# Patient Record
Sex: Male | Born: 1955 | Race: White | Hispanic: No | Marital: Married | Smoking: Never smoker
Health system: Southern US, Community
[De-identification: ages and names within clinical notes are randomized; demographics above are authoritative.]

## PROBLEM LIST (undated history)

## (undated) DIAGNOSIS — G20 Parkinson's disease: Secondary | ICD-10-CM

## (undated) DIAGNOSIS — R6520 Severe sepsis without septic shock: Secondary | ICD-10-CM

## (undated) DIAGNOSIS — I10 Essential (primary) hypertension: Secondary | ICD-10-CM

## (undated) DIAGNOSIS — R079 Chest pain, unspecified: Secondary | ICD-10-CM

## (undated) DIAGNOSIS — A419 Sepsis, unspecified organism: Secondary | ICD-10-CM

## (undated) DIAGNOSIS — M419 Scoliosis, unspecified: Secondary | ICD-10-CM

## (undated) DIAGNOSIS — Z8616 Personal history of COVID-19: Secondary | ICD-10-CM

## (undated) DIAGNOSIS — N200 Calculus of kidney: Secondary | ICD-10-CM

## (undated) DIAGNOSIS — E119 Type 2 diabetes mellitus without complications: Secondary | ICD-10-CM

## (undated) DIAGNOSIS — R413 Other amnesia: Secondary | ICD-10-CM

## (undated) DIAGNOSIS — E7800 Pure hypercholesterolemia, unspecified: Secondary | ICD-10-CM

## (undated) DIAGNOSIS — T8859XA Other complications of anesthesia, initial encounter: Secondary | ICD-10-CM

## (undated) HISTORY — DX: Scoliosis, unspecified: M41.9

## (undated) HISTORY — PX: COLONOSCOPY: SHX174

## (undated) HISTORY — DX: Parkinson's disease: G20

## (undated) HISTORY — DX: Chest pain, unspecified: R07.9

## (undated) HISTORY — PX: HERNIA REPAIR: SHX51

## (undated) HISTORY — DX: Sepsis, unspecified organism: A41.9

## (undated) HISTORY — DX: Essential (primary) hypertension: I10

## (undated) HISTORY — DX: Type 2 diabetes mellitus without complications: E11.9

---
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
## 2005-09-09 ENCOUNTER — Ambulatory Visit: Payer: Self-pay | Admitting: Gastroenterology

## 2005-09-20 ENCOUNTER — Ambulatory Visit (HOSPITAL_COMMUNITY): Admission: RE | Admit: 2005-09-20 | Discharge: 2005-09-20 | Payer: Self-pay | Admitting: Gastroenterology

## 2005-09-20 ENCOUNTER — Encounter (INDEPENDENT_AMBULATORY_CARE_PROVIDER_SITE_OTHER): Payer: Self-pay | Admitting: Specialist

## 2005-09-20 ENCOUNTER — Ambulatory Visit: Payer: Self-pay | Admitting: Gastroenterology

## 2006-06-03 ENCOUNTER — Emergency Department (HOSPITAL_COMMUNITY): Admission: EM | Admit: 2006-06-03 | Discharge: 2006-06-03 | Payer: Self-pay | Admitting: Emergency Medicine

## 2006-06-09 ENCOUNTER — Emergency Department (HOSPITAL_COMMUNITY): Admission: EM | Admit: 2006-06-09 | Discharge: 2006-06-09 | Payer: Self-pay | Admitting: Emergency Medicine

## 2009-02-22 ENCOUNTER — Emergency Department (HOSPITAL_COMMUNITY): Admission: EM | Admit: 2009-02-22 | Discharge: 2009-02-22 | Payer: Self-pay | Admitting: Emergency Medicine

## 2009-02-26 ENCOUNTER — Ambulatory Visit (HOSPITAL_COMMUNITY): Admission: RE | Admit: 2009-02-26 | Discharge: 2009-02-26 | Payer: Self-pay | Admitting: Urology

## 2010-05-02 LAB — URINALYSIS, ROUTINE W REFLEX MICROSCOPIC
Bilirubin Urine: NEGATIVE
Leukocytes, UA: NEGATIVE
Nitrite: NEGATIVE
Protein, ur: NEGATIVE mg/dL
Urobilinogen, UA: 0.2 mg/dL (ref 0.0–1.0)
pH: 5 (ref 5.0–8.0)

## 2010-05-02 LAB — DIFFERENTIAL
Basophils Absolute: 0 10*3/uL (ref 0.0–0.1)
Basophils Relative: 0 % (ref 0–1)
Eosinophils Relative: 1 % (ref 0–5)
Lymphocytes Relative: 29 % (ref 12–46)
Monocytes Absolute: 0.6 10*3/uL (ref 0.1–1.0)
Monocytes Relative: 8 % (ref 3–12)

## 2010-05-02 LAB — BASIC METABOLIC PANEL
BUN: 29 mg/dL — ABNORMAL HIGH (ref 6–23)
CO2: 22 mEq/L (ref 19–32)
Creatinine, Ser: 1.03 mg/dL (ref 0.4–1.5)
GFR calc non Af Amer: >60 mL/min (ref >60)

## 2010-05-02 LAB — URINE MICROSCOPIC-ADD ON

## 2010-05-02 LAB — CBC: RBC: 4.57 MIL/uL (ref 4.22–5.81)

## 2010-07-02 NOTE — Consult Note (Signed)
NAME:  Leonard Mercer, Leonard Mercer NO.:  [id number]   MEDICAL RECORD NO.:  [id number]          PATIENT TYPE:  AMB   LOCATION:                                FACILITY:  [facility]   PHYSICIAN:  Kassie Mends, M.D.      DATE OF BIRTH:  17-Feb-1955   DATE OF CONSULTATION:  09/09/2005  DATE OF DISCHARGE:                                   CONSULTATION   Dear Dr. Juanetta Gosling:   I am seeing Leonard Mercer as a new patient in consultation per your request.  I  am seeing Leonard Mercer for blood in the stool.   Leonard Mercer is a 55 year old male who has seen his blood in his stool when  wiping over the last couple of months.  He was seen in your office and  laboratories were obtained.  His white count was 9,  hemoglobin 15.9,  platelets 240,000, creatinine 1.38, albumin 4.4, AST 20, ALT 23, total  bilirubin 1.3.  He had 1 out of 3 hemoccult cards which were positive.  Mr.  Mercer denies weight loss, constipation, or diarrhea.  He may use one Motrin  a month.  He denies heartburn, indigestion, abdominal pain, black tarry  stool, nausea, vomiting, or difficulty swallowing.   PAST MEDICAL HISTORY:  Hyperlipidemia and hypertension.  He has no past  surgical history.   He is not allergic to any medications. He takes Cozaar, multivitamins, and  Motrin p.r.n. He denies any family history of colon, uterine, or ovarian  cancer.  Maternal grandmother had breast cancer.  He denies any family  history of colon polyps. He is married and has one child.  He is an Psychologist, prison and probation services at [employer]  He denies any tobacco or alcohol use.   REVIEW OF SYSTEMS:  As per the HPI.  Otherwise, negative except he denies  ripping, tearing rectal pain or rectal itching.   PHYSICAL EXAMINATION:  VITAL SIGNS:  His weight is 191.5 pounds, height 6  feet, temperature 98.5, blood pressure 132/90, pulse 68.  GENERAL:  He is no apparent distress, alert and oriented times four. HEENT:  Normocephalic and atraumatic.  Pupils equal,  round, and reactive to light.  Mouth with no oral lesions.  Posterior pharynx without erythema or exudate.  NECK:  Full range of motion and no lymphadenopathy. LUNGS:  Clear to  auscultation bilaterally.  CARDIOVASCULAR:  Regular rhythm, no murmur.  Normal S1 and S2.  ABDOMEN:  Bowel sounds are present, soft, nontender,  nondistended.  No rebound or guarding.  No hepatosplenomegaly.  EXTREMITIES:  Without clubbing, cyanosis, or edema.  No focal neurologic deficits.   Leonard Mercer is a 55 year old male with heme-positive stool.  He is at average  risk for developing colon cancer.  Thank you for allowing me to see Leonard Mercer in consultation.  My list of  recommendations follows.   I recommend colonoscopy to evaluate heme-positive stools and EGD if the  colonoscopy does not reveal source for his heme-positive stool.  He may  follow up with our office as needed.   Please feel  free to contact me at [phone] with additional questions.      Kassie Mends, M.D.  Electronically Signed     SM/MEDQ  D:  09/09/2005  T:  09/09/2005  Job:  [id number]   cc:   Ramon Dredge L. Juanetta Gosling, M.D.  Fax: [phone]

## 2010-07-02 NOTE — Op Note (Signed)
NAME:  Leonard Mercer, Leonard Mercer                ACCOUNT NO.:  [id number]   MEDICAL RECORD NO.:  [id number]          PATIENT TYPE:  AMB   LOCATION:  DAY                           FACILITY:  [facility]   PHYSICIAN:  Kassie Mends, M.D.      DATE OF BIRTH:  [birth year]   DATE OF PROCEDURE:  DATE OF DISCHARGE:                                 OPERATIVE REPORT   PROCEDURE:  Colonoscopy with cold forceps biopsy.   INDICATIONS FOR PROCEDURE:  Leonard Mercer is a 55 year old male who presented  with heme-positive stools.   MEDICATIONS:  1. Demerol 100 mg IV.  2. Versed 6 mg IV.   FINDINGS:  1. Two 4-mm sessile ascending colon polyps removed via cold forceps; one      flat 6-mm polyp removed via cold forceps.  2. Rare sigmoid diverticulosis.  3. Otherwise no inflammatory changes, masses, or vascular ectasis seen.      Normal retroflexed view of the rectum.   RECOMMENDATIONS:  1. Follow up biopsies.  If the polyps are adenomatous, then next screening      colonoscopy should be in five years.  His first-degree relatives will      need to begin colon cancer screening at age 12 and every five years      after that.  The multiple colon polyps are likely source of his heme-      positive stools.  2. High-fiber diet.  High-fiber diet handout and handout on diverticulosis      given to the patient.   PROCEDURE TECHNIQUE:  A physical exam was performed and informed consent was  obtained from the patient after explaining the risks, benefits and  alternatives to the procedure which the patient appeared to understand and  so stated.  The patient was connecting to the monitoring device.  He was  placed in the left lateral position.  Continuous oxygen was provided by  nasal cannula and IV medicine administered through an indwelling cannula.  After  administration of sedation and rectal exam, the scope was advanced under  direct visualization to the cecum.  The scope was withdrawn while  subsequently examining the mucosal  integrity and anatomy on the way out.  The patient was recovered in the endoscopy suite and discharged to home in  satisfactory condition.      Kassie Mends, M.D.  Electronically Signed     SM/MEDQ  D:  09/21/2005  T:  09/21/2005  Job:  [id number]   cc:   Leonard Mercer, M.D.  Fax: [phone]

## 2010-09-16 ENCOUNTER — Encounter: Payer: Self-pay | Admitting: Gastroenterology

## 2011-02-16 ENCOUNTER — Encounter: Payer: Self-pay | Admitting: Gastroenterology

## 2012-01-03 ENCOUNTER — Encounter: Payer: Self-pay | Admitting: *Deleted

## 2012-05-29 DIAGNOSIS — Z9889 Other specified postprocedural states: Secondary | ICD-10-CM | POA: Insufficient documentation

## 2012-07-02 DIAGNOSIS — K5900 Constipation, unspecified: Secondary | ICD-10-CM | POA: Insufficient documentation

## 2014-07-20 ENCOUNTER — Emergency Department (HOSPITAL_COMMUNITY): Payer: BC Managed Care – PPO

## 2014-07-20 ENCOUNTER — Encounter (HOSPITAL_COMMUNITY): Payer: Self-pay | Admitting: *Deleted

## 2014-07-20 ENCOUNTER — Emergency Department (HOSPITAL_COMMUNITY)
Admission: EM | Admit: 2014-07-20 | Discharge: 2014-07-20 | Disposition: A | Discharge status: Home / Self Care | Payer: BC Managed Care – PPO | Attending: Emergency Medicine | Admitting: Emergency Medicine

## 2014-07-20 DIAGNOSIS — I10 Essential (primary) hypertension: Secondary | ICD-10-CM | POA: Diagnosis not present

## 2014-07-20 DIAGNOSIS — Z79899 Other long term (current) drug therapy: Secondary | ICD-10-CM | POA: Diagnosis not present

## 2014-07-20 DIAGNOSIS — R1030 Lower abdominal pain, unspecified: Secondary | ICD-10-CM | POA: Diagnosis present

## 2014-07-20 DIAGNOSIS — N201 Calculus of ureter: Secondary | ICD-10-CM | POA: Diagnosis not present

## 2014-07-20 DIAGNOSIS — Z7982 Long term (current) use of aspirin: Secondary | ICD-10-CM | POA: Insufficient documentation

## 2014-07-20 DIAGNOSIS — R109 Unspecified abdominal pain: Secondary | ICD-10-CM

## 2014-07-20 HISTORY — DX: Calculus of kidney: N20.0

## 2014-07-20 LAB — BASIC METABOLIC PANEL
Anion gap: 10 (ref 5–15)
BUN: 29 mg/dL — ABNORMAL HIGH (ref 6–20)
CO2: 25 mmol/L (ref 22–32)
Calcium: 8.8 mg/dL — ABNORMAL LOW (ref 8.9–10.3)
Chloride: 104 mmol/L (ref 101–111)
Creatinine, Ser: 1.1 mg/dL (ref 0.61–1.24)
GFR calc Af Amer: >60 mL/min (ref >60)
Glucose, Bld: 142 mg/dL — ABNORMAL HIGH (ref 65–99)
POTASSIUM: 4.1 mmol/L (ref 3.5–5.1)
SODIUM: 139 mmol/L (ref 135–145)

## 2014-07-20 LAB — CBC
HEMATOCRIT: 44.3 % (ref 39.0–52.0)
HEMOGLOBIN: 15.5 g/dL (ref 13.0–17.0)
MCH: 31.4 pg (ref 26.0–34.0)
MCHC: 35 g/dL (ref 30.0–36.0)
MCV: 89.9 fL (ref 78.0–100.0)
PLATELETS: 203 10*3/uL (ref 150–400)
RBC: 4.93 MIL/uL (ref 4.22–5.81)
RDW: 12.3 % (ref 11.5–15.5)
WBC: 11.4 10*3/uL — ABNORMAL HIGH (ref 4.0–10.5)

## 2014-07-20 LAB — URINE MICROSCOPIC-ADD ON

## 2014-07-20 LAB — URINALYSIS, ROUTINE W REFLEX MICROSCOPIC
Bilirubin Urine: NEGATIVE
Glucose, UA: NEGATIVE mg/dL
KETONES UR: NEGATIVE mg/dL
LEUKOCYTES UA: NEGATIVE
Nitrite: NEGATIVE
PH: 5.5 (ref 5.0–8.0)
PROTEIN: NEGATIVE mg/dL
SPECIFIC GRAVITY, URINE: 1.02 (ref 1.005–1.030)
Urobilinogen, UA: 0.2 mg/dL (ref 0.0–1.0)

## 2014-07-20 MED ORDER — HYDROMORPHONE HCL 1 MG/ML IJ SOLN
1.0000 mg | Freq: Once | INTRAMUSCULAR | Status: AC
  Administered 2014-07-20: 1 mg via INTRAVENOUS
  Filled 2014-07-20: qty 1

## 2014-07-20 MED ORDER — KETOROLAC TROMETHAMINE 30 MG/ML IJ SOLN
30.0000 mg | Freq: Once | INTRAMUSCULAR | Status: AC
  Administered 2014-07-20: 30 mg via INTRAVENOUS
  Filled 2014-07-20: qty 1

## 2014-07-20 MED ORDER — OXYCODONE-ACETAMINOPHEN 5-325 MG PO TABS: 1.0000 | ORAL_TABLET | ORAL | Status: DC | PRN

## 2014-07-20 MED ORDER — TAMSULOSIN HCL 0.4 MG PO CAPS: 0.4000 mg | ORAL_CAPSULE | Freq: Every day | ORAL | Status: DC

## 2014-07-20 MED ORDER — IBUPROFEN 600 MG PO TABS: 600.0000 mg | ORAL_TABLET | Freq: Three times a day (TID) | ORAL | Status: DC | PRN

## 2014-07-20 MED ORDER — ONDANSETRON HCL 4 MG/2ML IJ SOLN
4.0000 mg | Freq: Once | INTRAMUSCULAR | Status: AC
  Administered 2014-07-20: 4 mg via INTRAVENOUS
  Filled 2014-07-20: qty 2

## 2014-07-20 MED ORDER — ONDANSETRON 8 MG PO TBDP: 8.0000 mg | ORAL_TABLET | Freq: Three times a day (TID) | ORAL | Status: DC | PRN

## 2014-07-20 NOTE — Discharge Instructions (Signed)
Ureteral Colic (Kidney Stones) °Ureteral colic is the result of a condition when kidney stones form inside the kidney. Once kidney stones are formed they may move into the tube that connects the kidney with the bladder (ureter). If this occurs, this condition may cause pain (colic) in the ureter.  °CAUSES  °Pain is caused by stone movement in the ureter and the obstruction caused by the stone. °SYMPTOMS  °The pain comes and goes as the ureter contracts around the stone. The pain is usually intense, sharp, and stabbing in character. The location of the pain may move as the stone moves through the ureter. When the stone is near the kidney the pain is usually located in the back and radiates to the belly (abdomen). When the stone is ready to pass into the bladder the pain is often located in the lower abdomen on the side the stone is located. At this location, the symptoms may mimic those of a urinary tract infection with urinary frequency. Once the stone is located here it often passes into the bladder and the pain disappears completely. °TREATMENT  °· Your caregiver will provide you with medicine for pain relief. °· You may require specialized follow-up X-rays. °· The absence of pain does not always mean that the stone has passed. It may have just stopped moving. If the urine remains completely obstructed, it can cause loss of kidney function or even complete destruction of the involved kidney. It is your responsibility and in your interest that X-rays and follow-ups as suggested by your caregiver are completed. Relief of pain without passage of the stone can be associated with severe damage to the kidney, including loss of kidney function on that side. °· If your stone does not pass on its own, additional measures may be taken by your caregiver to ensure its removal. °HOME CARE INSTRUCTIONS  °· Increase your fluid intake. Water is the preferred fluid since juices containing vitamin C may acidify the urine making it  less likely for certain stones (uric acid stones) to pass. °· Strain all urine. A strainer will be provided. Keep all particulate matter or stones for your caregiver to inspect. °· Take your pain medicine as directed. °· Make a follow-up appointment with your caregiver as directed. °· Remember that the goal is passage of your stone. The absence of pain does not mean the stone is gone. Follow your caregiver's instructions. °· Only take over-the-counter or prescription medicines for pain, discomfort, or fever as directed by your caregiver. °SEEK MEDICAL CARE IF:  °· Pain cannot be controlled with the prescribed medicine. °· You have a fever. °· Pain continues for longer than your caregiver advises it should. °· There is a change in the pain, and you develop chest discomfort or constant abdominal pain. °· You feel faint or pass out. °MAKE SURE YOU:  °· Understand these instructions. °· Will watch your condition. °· Will get help right away if you are not doing well or get worse. °Document Released: 11/10/2004 Document Revised: 05/28/2012 Document Reviewed: 07/28/2010 °ExitCare® Patient Information ©2015 [employer]. This information is not intended to replace advice given to you by your health care provider. Make sure you discuss any questions you have with your health care provider. ° °

## 2014-07-20 NOTE — ED Provider Notes (Signed)
CSN: [id number]     Arrival date & time 07/20/14  7829 History   First MD Initiated Contact with Patient 07/20/14 [phone]     Chief Complaint  Patient presents with  . Flank Pain    left flank pain with past hx of kidney stone. feels the same per patient      HPI Patient presents with severe left flank pain with associated nausea.  This began today.  He has a history kidney stones.  His last kidney stone was approximately 2 years ago.  He denies vomiting.  Denies abdominal pain.  He states his discomfort and pain is focused in his left flank.  This pain is moderate to severe in severity at this time.  He is pacing in the room.   Past Medical History  Diagnosis Date  . Hypertension   . Chest pain   . Kidney stone    History reviewed. No pertinent past surgical history. Family History  Problem Relation Age of Onset  . Hypertension Mother   . Diabetes Father   . Heart disease Maternal Grandfather    History  Substance Use Topics  . Smoking status: Unknown If Ever Smoked  . Smokeless tobacco: Not on file  . Alcohol Use: Yes    Review of Systems  All other systems reviewed and are negative.     Allergies  Review of patient's allergies indicates no known allergies.  Home Medications   Prior to Admission medications   Medication Sig Start Date End Date Taking? Authorizing Provider  losartan (COZAAR) 100 MG tablet Take 50 mg by mouth daily.    Yes Historical Provider, MD  metFORMIN (GLUCOPHAGE) 1000 MG tablet Take 1,000 mg by mouth 2 (two) times daily with a meal.   Yes Historical Provider, MD  pravastatin (PRAVACHOL) 20 MG tablet Take 20 mg by mouth daily.   Yes Historical Provider, MD  aspirin 81 MG tablet Take 81 mg by mouth daily.    Historical Provider, MD  Multiple Vitamin (MULTI-VITAMIN PO) Take by mouth daily.    Historical Provider, MD   BP 160/99 mmHg  Pulse 73  Temp(Src) 97.7 F (36.5 C) (Oral)  Resp 20  Ht  (1.753 m)  Wt 189 lb (85.73 kg)  BMI 27.90  kg/m2  SpO2 100% Physical Exam  Constitutional: He is oriented to person, place, and time. He appears well-developed and well-nourished.  Uncomfortable appearing  HENT:  Head: Normocephalic and atraumatic.  Eyes: EOM are normal.  Neck: Normal range of motion.  Cardiovascular: Normal rate, regular rhythm, normal heart sounds and intact distal pulses.   Pulmonary/Chest: Effort normal and breath sounds normal. No respiratory distress.  Abdominal: Soft. He exhibits no distension. There is no tenderness.  Musculoskeletal: Normal range of motion.  Neurological: He is alert and oriented to person, place, and time.  Skin: Skin is warm and dry.  Psychiatric: He has a normal mood and affect. Judgment normal.  Nursing note and vitals reviewed.   ED Course  Procedures (including critical care time) Labs Review Labs Reviewed  CBC - Abnormal; Notable for the following:    WBC 11.4 (*)    All other components within normal limits  BASIC METABOLIC PANEL - Abnormal; Notable for the following:    Glucose, Bld 142 (*)    BUN 29 (*)    Calcium 8.8 (*)    All other components within normal limits  URINALYSIS, ROUTINE W REFLEX MICROSCOPIC (NOT AT [facility]) - Abnormal; Notable for the following:  Hgb urine dipstick LARGE (*)    All other components within normal limits  URINE CULTURE  URINE MICROSCOPIC-ADD ON    Imaging Review Ct Renal Stone Study  07/20/2014   CLINICAL DATA:  Left flank pain.  Difficulty with urination.  EXAM: CT ABDOMEN AND PELVIS WITHOUT CONTRAST  TECHNIQUE: Multidetector CT imaging of the abdomen and pelvis was performed following the standard protocol without IV contrast.  COMPARISON:  CT 02/22/2009  FINDINGS: The included lung bases are clear.  There is a 5-6 mm obstructing stone at the left ureterovesicular junction with mild resultant hydroureteronephrosis and perinephric stranding. At least 3 additional nonobstructing stones are seen in the left kidney. No definite right  nephrolithiasis, no right obstructive uropathy.  Evaluation of the remaining solid and hollow viscera is limited given lack of contrast. The unenhanced liver, gallbladder, spleen, and adrenal glands are normal. Fatty infiltration of the pancreas without surrounding inflammation or ductal dilatation.  Stomach is decompressed. Small hiatal hernia. There are no dilated or thickened bowel loops. The appendix is normal. Moderate volume of colonic stool without colonic wall thickening. Moderate diverticulosis of the distal colon without diverticulitis. No free air, free fluid, or intra-abdominal fluid collection.  No retroperitoneal adenopathy. Abdominal aorta is normal in caliber. Tortuosity of the abdominal aorta with scattered atherosclerosis, no aneurysm.  In the pelvis prostate gland is normal in size. There is minimal fat in the right inguinal canal. No pelvic free fluid. No pelvic adenopathy.  There are no acute or suspicious osseous abnormalities. Scoliotic curvature of the spine.  IMPRESSION: 1. Obstructing throat 6 mm stone at the left ureterovesicular junction with resultant hydroureteronephrosis and perinephric stranding. 2. At least 3 additional nonobstructing stones in the left kidney. 3. Diverticulosis without diverticulitis.   Electronically Signed   By: Rubye OaksMelanie  Ehinger M.D.   On: 07/20/2014 05:05  I personally reviewed the imaging tests through PACS system I reviewed available ER/hospitalization records through the EMR    EKG Interpretation None      MDM   Final diagnoses:  Flank pain      5:41 AM Pain improved. Dc home. Urology follow up. Standard stone precautions  Azalia BilisKevin Shaylea Ucci, MD 07/20/14 [phone]

## 2014-07-21 LAB — URINE CULTURE
Colony Count: NO GROWTH
Culture: NO GROWTH

## 2014-07-30 MED FILL — Oxycodone w/ Acetaminophen Tab 5-325 MG: ORAL | Qty: 6 | Status: AC

## 2015-05-02 DIAGNOSIS — E119 Type 2 diabetes mellitus without complications: Secondary | ICD-10-CM | POA: Insufficient documentation

## 2015-05-02 DIAGNOSIS — I10 Essential (primary) hypertension: Secondary | ICD-10-CM | POA: Insufficient documentation

## 2015-05-20 DIAGNOSIS — E785 Hyperlipidemia, unspecified: Secondary | ICD-10-CM | POA: Insufficient documentation

## 2015-05-20 DIAGNOSIS — N529 Male erectile dysfunction, unspecified: Secondary | ICD-10-CM | POA: Insufficient documentation

## 2015-05-20 DIAGNOSIS — E782 Mixed hyperlipidemia: Secondary | ICD-10-CM | POA: Insufficient documentation

## 2015-05-26 DIAGNOSIS — G8929 Other chronic pain: Secondary | ICD-10-CM | POA: Insufficient documentation

## 2015-05-26 DIAGNOSIS — R251 Tremor, unspecified: Secondary | ICD-10-CM | POA: Insufficient documentation

## 2016-04-01 ENCOUNTER — Ambulatory Visit (INDEPENDENT_AMBULATORY_CARE_PROVIDER_SITE_OTHER): Payer: BC Managed Care – PPO | Admitting: Urology

## 2016-04-01 DIAGNOSIS — N5201 Erectile dysfunction due to arterial insufficiency: Secondary | ICD-10-CM | POA: Diagnosis not present

## 2016-04-01 DIAGNOSIS — E291 Testicular hypofunction: Secondary | ICD-10-CM | POA: Diagnosis not present

## 2016-09-12 ENCOUNTER — Encounter: Payer: Self-pay | Admitting: Neurology

## 2016-09-12 ENCOUNTER — Telehealth: Payer: Self-pay | Admitting: Neurology

## 2016-09-12 ENCOUNTER — Ambulatory Visit (INDEPENDENT_AMBULATORY_CARE_PROVIDER_SITE_OTHER): Payer: BC Managed Care – PPO | Admitting: Neurology

## 2016-09-12 DIAGNOSIS — G20 Parkinson's disease: Secondary | ICD-10-CM | POA: Diagnosis not present

## 2016-09-12 DIAGNOSIS — G20A1 Parkinson's disease without dyskinesia, without mention of fluctuations: Secondary | ICD-10-CM

## 2016-09-12 HISTORY — DX: Parkinson's disease without dyskinesia, without mention of fluctuations: G20.A1

## 2016-09-12 HISTORY — DX: Parkinson's disease: G20

## 2016-09-12 MED ORDER — PRAMIPEXOLE DIHYDROCHLORIDE 0.125 MG PO TABS: 0.3750 mg | ORAL_TABLET | Freq: Three times a day (TID) | ORAL | 1 refills | Status: DC

## 2016-09-12 NOTE — Patient Instructions (Signed)
   We will get MRI of the brain.  We will start Mirapex 0.125 mg tablets:  Week 1-2  One three times a day.  Week 3-4  2 three times a day  Week 5-6 3 tablets thee times a day.  Call for a prescription of the 0.5 mg tablets.  Mirapex (pramipexole) may result in confusion or hallucinations, dizziness, drowsiness, or nausea. If any significant side effects are noted, please contact our office.

## 2016-09-12 NOTE — Progress Notes (Signed)
Reason for visit: Tremor  Referring physician: Dr. Ernestine ConradKaplan  Leonard Mercer is a 61 y.o. male  History of present illness:  Leonard Mercer is a 61 year old right-handed white male with a history of a tremor that developed on the right hand about 2 years ago. The tremor is a true resting tremor that has gradually worsened over time, and more recently has started to involve the left hand as well. He indicates that his handwriting has changed, he has developed micrographia and some sloppiness of the handwriting. The tremor does not interfere with his ability to feed himself. He has been told by coworkers that he keeps his right arm in flexion when he walks. The patient has not had any definite change in speech or swallowing, he denies any changes in balance or difficulty getting up from a seated position. He reports no numbness or weakness of the extremities and no problems controlling the bowels or the bladder. He denies any family history of tremor. He is sent to this office for further evaluation. He has not reported any changes in memory or concentration.  Past Medical History:  Diagnosis Date  . Chest pain   . Hypertension   . Kidney stone     Past Surgical History:  Procedure Laterality Date  . HERNIA REPAIR      Family History  Problem Relation Age of Onset  . Hypertension Mother   . Diabetes Father   . Heart disease Maternal Grandfather     Social history:  reports that he has never smoked. He has never used smokeless tobacco. He reports that he drinks alcohol. He reports that he does not use drugs.  Medications:  Prior to Admission medications   Medication Sig Start Date End Date Taking? Authorizing Provider  losartan (COZAAR) 50 MG tablet Take 50 mg by mouth daily. 09/01/16  Yes [provider]  metFORMIN (GLUCOPHAGE) 1000 MG tablet Take 1,000 mg by mouth 2 (two) times daily with a meal.   Yes [provider]  Multiple Vitamin (MULTI-VITAMIN PO) Take by  mouth daily.   Yes [provider]  [facility] 0.5 milliliter intramuscularly 07/02/16  Yes [provider]  pravastatin (PRAVACHOL) 20 MG tablet Take 20 mg by mouth daily.    [provider]     No Known Allergies  ROS:  Out of a complete 14 system review of symptoms, the patient complains only of the following symptoms, and all other reviewed systems are negative.  Tremor Sloppy handwriting  Blood pressure (!) 136/96, pulse 76, height 5\' 11"  (1.803 m), weight 184 lb 8 oz (83.7 kg).  Physical Exam  General: The patient is alert and cooperative at the time of the examination.  Eyes: Pupils are equal, round, and reactive to light. Discs are flat bilaterally.  Neck: The neck is supple, no carotid bruits are noted.  Respiratory: The respiratory examination is clear.  Cardiovascular: The cardiovascular examination reveals a regular rate and rhythm, no obvious murmurs or rubs are noted.  Skin: Extremities are without significant edema.  Neurologic Exam  Mental status: The patient is alert and oriented x 3 at the time of the examination. The patient has apparent normal recent and remote memory, with an apparently normal attention span and concentration ability.  Cranial nerves: Facial symmetry is present. There is good sensation of the face to pinprick and soft touch bilaterally. The strength of the facial muscles and the muscles to head turning and shoulder shrug are normal  bilaterally. Speech is well enunciated, no aphasia or dysarthria is noted. Extraocular movements are full. Visual fields are full. The tongue is midline, and the patient has symmetric elevation of the soft palate. No obvious hearing deficits are noted. Mild masking of the face is seen.  Motor: The motor testing reveals 5 over 5 strength of all 4 extremities. Good symmetric motor tone is noted throughout.  Sensory: Sensory testing is intact to pinprick, soft touch, vibration  sensation, and position sense on all 4 extremities. No evidence of extinction is noted.  Coordination: Cerebellar testing reveals good finger-nose-finger and heel-to-shin bilaterally. A resting tremors noted on the right greater left upper extremities.  Gait and station: The patient is able to arise from a seated position with arms crossed. Once up, he is able to ambulate independently the good stride and good turns, there is decreased arm swing on the right with tremor seen on the right hand with walking. Tandem gait is normal. Romberg is negative. No drift is seen.  Reflexes: Deep tendon reflexes are symmetric and normal bilaterally. Toes are downgoing bilaterally.   Assessment/Plan:  1. Parkinson's disease  The patient appears to have Parkinson's disease primarily with right-sided features. The patient will be placed on Mirapex working up to 0.375 mg 3 times daily. He will call me at that point and we will convert to 0.5 mg 3 times daily. The patient will watch out for side effects. He will be sent for MRI evaluation of the brain. He will follow-up in 5 months. He is to remain active.  Marlan Palau. Keith Yuan Gann MD 09/12/2016 11:31 AM  [facility] [address] [place], [place] [id number]  Phone [phone] Fax [phone]

## 2016-09-12 NOTE — Telephone Encounter (Signed)
Pt called to advise he will not be in town all next week and also would like to have the MRI appt on weekend.

## 2016-09-12 NOTE — Telephone Encounter (Signed)
Noted, thank you

## 2016-09-13 ENCOUNTER — Telehealth: Payer: Self-pay | Admitting: Neurology

## 2016-09-14 NOTE — Telephone Encounter (Signed)
error 

## 2016-09-16 ENCOUNTER — Telehealth: Payer: Self-pay | Admitting: Neurology

## 2016-09-16 NOTE — Telephone Encounter (Signed)
I called patient. The patient had gone off of his pravastatin previously as he felt this was the etiology of his tremors, he likely has Parkinson's disease, okay to go back on pravastatin.

## 2016-09-16 NOTE — Telephone Encounter (Signed)
Pt called the office said he stopped taking pravastatin (PRAVACHOL) 20 MG tablet about 2-3 wks, a side effect could be muscle twitching. He said since Dr Anne HahnWillis dx him with parkinsons should he start it again. Please call to discuss

## 2016-09-19 NOTE — Telephone Encounter (Signed)
[facility].

## 2016-10-01 ENCOUNTER — Ambulatory Visit
Admission: RE | Admit: 2016-10-01 | Discharge: 2016-10-01 | Disposition: A | Discharge status: Home / Self Care | Payer: BC Managed Care – PPO | Source: Ambulatory Visit | Attending: Neurology | Admitting: Neurology

## 2016-10-01 DIAGNOSIS — G20 Parkinson's disease: Secondary | ICD-10-CM

## 2016-10-03 ENCOUNTER — Telehealth: Payer: Self-pay | Admitting: Neurology

## 2016-10-03 NOTE — Telephone Encounter (Signed)
I called the patient. MRI the brain was unremarkable, the patient likely has true idiopathic Parkinson's disease.   MRI brain 10/02/16:  IMPRESSION:  This noncontrasted MRI of the brain shows the following: 1.    There are a few T2/FLAIR hyperintense foci in the hemispheres consistent with minimal age-appropriate chronic microvessel ischemic change. None of these appear to be acute. 2.    Maxillary and ethmoid chronic sinusitis 3.   There are no acute findings

## 2016-10-24 ENCOUNTER — Telehealth: Payer: Self-pay | Admitting: Neurology

## 2016-10-24 MED ORDER — PRAMIPEXOLE DIHYDROCHLORIDE 0.5 MG PO TABS: 0.5000 mg | ORAL_TABLET | Freq: Three times a day (TID) | ORAL | 3 refills | Status: DC

## 2016-10-24 NOTE — Addendum Note (Signed)
Addended by: York SpanielWILLIS, Latondra Gebhart K on: 10/24/2016 02:58 PM   Modules accepted: Orders

## 2016-10-24 NOTE — Telephone Encounter (Signed)
A prescription for this 0.5 mg Mirapex tablets will be sent in taking 1 tablet 3 times daily.

## 2016-10-24 NOTE — Telephone Encounter (Addendum)
Pt called in he will finish titrating up on pramipexole (MIRAPEX) 0.125 MG tablet in 4 days and is wanting new RX sent to [facility]

## 2017-02-21 ENCOUNTER — Encounter: Payer: Self-pay | Admitting: Neurology

## 2017-02-21 ENCOUNTER — Ambulatory Visit: Payer: BC Managed Care – PPO | Admitting: Neurology

## 2017-02-21 ENCOUNTER — Encounter (INDEPENDENT_AMBULATORY_CARE_PROVIDER_SITE_OTHER): Payer: Self-pay

## 2017-02-21 VITALS — BP 141/87 | HR 92 | Ht 71.0 in | Wt 177.0 lb

## 2017-02-21 DIAGNOSIS — G20 Parkinson's disease: Secondary | ICD-10-CM | POA: Diagnosis not present

## 2017-02-21 MED ORDER — CARBIDOPA-LEVODOPA 25-100 MG PO TABS: 1.0000 | ORAL_TABLET | Freq: Three times a day (TID) | ORAL | 1 refills | Status: DC

## 2017-02-21 NOTE — Progress Notes (Signed)
Reason for visit: Parkinson's disease  Leonard Mercer is an 62 y.o. male  History of present illness:  Leonard Mercer is a 62 year old right-handed white male with a history of Parkinson's disease.  The patient has been placed on Mirapex, he has been worked up to the 0.5 mg 3 times daily dosing.  He has tolerated this fairly well.  He continues to have tremors on both upper extremities, right greater than left.  He has fallen on one occasion since last seen around Thanksgiving of 2018.  He did sustain a laceration on his head.  The patient has not had any further falls.  He denies issues with choking with swallowing.  He is trying to exercise regularly, he is not sleeping well at night and he feels fatigued during the day and has difficulty concentrating and remembering things during the day.  He returns for an evaluation.  Past Medical History:  Diagnosis Date  . Chest pain   . Hypertension   . Kidney stone   . Parkinson's disease (HCC) 09/12/2016    Past Surgical History:  Procedure Laterality Date  . HERNIA REPAIR      Family History  Problem Relation Age of Onset  . Hypertension Mother   . Diabetes Father   . Heart disease Maternal Grandfather     Social history:  reports that  has never smoked. he has never used smokeless tobacco. He reports that he drinks alcohol. He reports that he does not use drugs.   No Known Allergies  Medications:  Prior to Admission medications   Medication Sig Start Date End Date Taking? Authorizing Provider  losartan (COZAAR) 50 MG tablet Take 50 mg by mouth daily. 09/01/16  Yes [provider]  metFORMIN (GLUCOPHAGE) 1000 MG tablet Take 1,000 mg by mouth 2 (two) times daily with a meal.   Yes [provider]  Multiple Vitamin (MULTI-VITAMIN PO) Take by mouth daily.   Yes [provider]  pramipexole (MIRAPEX) 0.5 MG tablet Take 1 tablet (0.5 mg total) by mouth 3 (three) times daily. 10/24/16  Yes [name], MD    pravastatin (PRAVACHOL) 20 MG tablet Take 20 mg by mouth daily.   Yes [provider]  [facility] injection inject 0.5 milliliter intramuscularly 07/02/16   [provider]    ROS:  Out of a complete 14 system review of symptoms, the patient complains only of the following symptoms, and all other reviewed systems are negative.  Tremors  Blood pressure (!) 141/87, pulse 92, height 5\' 11"  (1.803 m), weight 177 lb (80.3 kg).  Physical Exam  General: The patient is alert and cooperative at the time of the examination.  Skin: No significant peripheral edema is noted.   Neurologic Exam  Mental status: The patient is alert and oriented x 3 at the time of the examination. The patient has apparent normal recent and remote memory, with an apparently normal attention span and concentration ability.   Cranial nerves: Facial symmetry is present. Speech is normal, no aphasia or dysarthria is noted. Extraocular movements are full. Visual fields are full.  Masking of the face is seen.  Motor: The patient has good strength in all 4 extremities.  Sensory examination: Soft touch sensation is symmetric on the face, arms, and legs.  Coordination: The patient has good finger-nose-finger and heel-to-shin bilaterally.  Resting tremors are noted on both upper extremities, right greater than left.  Gait and station: The patient has the ability to arise from  a seated position with arms crossed.  Once up, the patient can walk independently, he has decreased arm swing on the right greater than left upper extremities.  Tremors are seen with walking.  Tandem gait is slightly unsteady.  Romberg is negative. No drift is seen.  Reflexes: Deep tendon reflexes are symmetric.   MRI brain 10/02/16:  IMPRESSION: This noncontrasted MRI of the brain shows the following: 1. There are a few T2/FLAIR hyperintense foci in the hemispheres consistent with minimal age-appropriate chronic microvessel  ischemic change. None of these appear to be acute. 2. Maxillary and ethmoid chronic sinusitis 3. There are no acute findings     Assessment/Plan:  1.  Parkinson's disease  2.  Insomnia  The patient is not sleeping well, he will try taking Benadryl at night for sleep, if this does not work we will try trazodone in the future.  The patient will be placed on Sinemet taking the 25/100 mg tablets taking 1/2 tablet 3 times daily for 6 weeks and then go to 1 full tablet 3 times daily.  The patient will follow-up in 5 months.  He will continue on the Mirapex.  We will consider increasing the dose of Mirapex in the future.    Marlan Palau. Keith Willis MD 02/21/2017 4:05 PM  [facility] [address] [place], [place] [address]  Phone [phone] Fax [phone]

## 2017-02-21 NOTE — Patient Instructions (Signed)
   We will start Sinemet 25/100 mg tablets, take 1/2 tablet three times a day for 6 weeks, then take one full tablet three times a day.  Take benadryl from 25 mg to 100 mg at night if needed for sleep.  Sinemet (carbidopa) may result in confusion or hallucinations, drowsiness, nausea, or dizziness. If any significant side effects are noted, please contact our office. Sinemet may not be well absorbed when taken with high protein meals, if tolerated it is best to take 30-45 minutes before you eat.

## 2017-02-23 ENCOUNTER — Other Ambulatory Visit: Payer: Self-pay | Admitting: *Deleted

## 2017-02-23 MED ORDER — PRAMIPEXOLE DIHYDROCHLORIDE 0.5 MG PO TABS: 0.5000 mg | ORAL_TABLET | Freq: Three times a day (TID) | ORAL | 3 refills | Status: DC

## 2017-06-11 ENCOUNTER — Other Ambulatory Visit: Payer: Self-pay | Admitting: Neurology

## 2017-07-23 ENCOUNTER — Other Ambulatory Visit: Payer: Self-pay | Admitting: Neurology

## 2017-08-03 ENCOUNTER — Encounter: Payer: Self-pay | Admitting: Neurology

## 2017-08-03 ENCOUNTER — Ambulatory Visit: Payer: BC Managed Care – PPO | Admitting: Neurology

## 2017-08-03 VITALS — BP 135/84 | HR 76 | Ht 71.0 in | Wt 169.5 lb

## 2017-08-03 DIAGNOSIS — G20 Parkinson's disease: Secondary | ICD-10-CM

## 2017-08-03 DIAGNOSIS — R498 Other voice and resonance disorders: Secondary | ICD-10-CM

## 2017-08-03 MED ORDER — PRAMIPEXOLE DIHYDROCHLORIDE 0.75 MG PO TABS: 0.7500 mg | ORAL_TABLET | Freq: Three times a day (TID) | ORAL | 1 refills | Status: DC

## 2017-08-03 MED ORDER — CARBIDOPA-LEVODOPA 25-100 MG PO TABS: 1.0000 | ORAL_TABLET | Freq: Three times a day (TID) | ORAL | 1 refills | Status: DC

## 2017-08-03 NOTE — Patient Instructions (Signed)
We will get speech therapy for the voice.  With the mirapex, we will increase the dose:  Week 1-2: 0.5/ 0.5/ 0.75  Week 3-4: 0.75/ 0.5/ 0.75  Then start 0.75 mg three times a day.

## 2017-08-03 NOTE — Progress Notes (Signed)
Reason for visit: Parkinson's disease  Leonard Mercer is an 62 y.o. male  History of present illness:  Leonard Mercer is a 62 year old right-handed white male with a history of Parkinson's disease.  The patient reports tremors affecting the jaw and both upper extremities.  The patient has noted problems with handwriting, he has significant micrographia.  He denies any falls, he occasionally have some choking with swallowing but this is not a big problem.  He reports some troubles with hypophonia.  He is tolerating the medications well, he is on Sinemet and Mirapex.  In general, he is sleeping fairly well at night at this point.   Past Medical History:  Diagnosis Date  . Chest pain   . Hypertension   . Kidney stone   . Parkinson's disease (HCC) 09/12/2016    Past Surgical History:  Procedure Laterality Date  . HERNIA REPAIR      Family History  Problem Relation Age of Onset  . Hypertension Mother   . Diabetes Father   . Heart disease Maternal Grandfather     Social history:  reports that he has never smoked. He has never used smokeless tobacco. He reports that he drinks alcohol. He reports that he does not use drugs.    Allergies  Allergen Reactions  . Ace Inhibitors Other (See Comments)    unknown    Medications:  Prior to Admission medications   Medication Sig Start Date End Date Taking? Authorizing Provider  carbidopa-levodopa (SINEMET IR) 25-100 MG tablet Take 1 tablet by mouth 3 (three) times daily. 08/03/17  Yes [name], MD  losartan (COZAAR) 50 MG tablet Take 50 mg by mouth daily. 09/01/16  Yes [provider]  metFORMIN (GLUCOPHAGE) 1000 MG tablet Take 1,000 mg by mouth 2 (two) times daily with a meal.   Yes [provider]  Multiple Vitamin (MULTI-VITAMIN PO) Take by mouth daily.   Yes [provider]  pravastatin (PRAVACHOL) 20 MG tablet Take 20 mg by mouth daily.   Yes [provider]  pramipexole (MIRAPEX) 0.75 MG  tablet Take 1 tablet (0.75 mg total) by mouth 3 (three) times daily. 08/03/17   [name], MD  [facility] injection inject 0.5 milliliter intramuscularly 07/02/16   [provider]    ROS:  Out of a complete 14 system review of symptoms, the patient complains only of the following symptoms, and all other reviewed systems are negative.  Tremors  Blood pressure 135/84, pulse 76, height 5\' 11"  (1.803 m), weight 169 lb 8 oz (76.9 kg).  Physical Exam  General: The patient is alert and cooperative at the time of the examination.  Skin: No significant peripheral edema is noted.   Neurologic Exam  Mental status: The patient is alert and oriented x 3 at the time of the examination. The patient has apparent normal recent and remote memory, with an apparently normal attention span and concentration ability.   Cranial nerves: Facial symmetry is present. Speech is hypophonic, not a phasic. Extraocular movements are full. Visual fields are full.  Masking the face is seen.  Motor: The patient has good strength in all 4 extremities.  Sensory examination: Soft touch sensation is symmetric on the face, arms, and legs.  Coordination: The patient has good finger-nose-finger and heel-to-shin bilaterally.  Resting tremors are seen with both upper extremities.  Gait and station: The patient has the ability to arise from a seated position with arms crossed.  Once up, the patient can  walk independently, there is some decreased arm swing on the right relative to the left, tremors are seen with the right arm with walking, at other times both hands with tremor.  Romberg is negative. No drift is seen.  Tandem gait is slightly unsteady.  Reflexes: Deep tendon reflexes are symmetric.   Assessment/Plan:  1.  Parkinson's disease  2.  Hypophonia  The patient will be sent for speech therapy evaluation for the hypophonia.  The Mirapex will be increased phasing into a dose of 0.75 mg 3 times  daily.  He will remain on the current dose of Sinemet.  He will follow-up in 5 months.  Marlan Palau. Keith  Paone MD 08/03/2017 10:19 AM  [facility] [address] [place], [place] [address]  Phone [phone] Fax [phone]

## 2017-09-12 ENCOUNTER — Ambulatory Visit: Payer: BC Managed Care – PPO | Attending: Neurology

## 2017-09-12 DIAGNOSIS — R471 Dysarthria and anarthria: Secondary | ICD-10-CM | POA: Insufficient documentation

## 2017-09-12 NOTE — Patient Instructions (Addendum)
  [email or web address]  Or do search for [employer] [facility] [facility] speech and language  Please also consider PT and OT evaluations in the future.

## 2017-09-12 NOTE — Therapy (Signed)
[facility] [address] [place], [place], [address] Phone: [phone]   Fax:  [phone]  Speech Language Pathology Evaluation  Patient Details  Name: Leonard Mercer MRN: [id number] Date of Birth: 1955/09/28 Referring Provider: Hortencia Conradi., MD   Encounter Date: 09/12/2017  End of Session - 09/13/17 0914    Visit Number  1    Number of Visits  17    Date for SLP Re-Evaluation  11/24/17    SLP Start Time  0936    SLP Stop Time   1016    SLP Time Calculation (min)  40 min    Activity Tolerance  Patient tolerated treatment well       Past Medical History:  Diagnosis Date  . Chest pain   . Hypertension   . Kidney stone   . Parkinson's disease (HCC) 09/12/2016    Past Surgical History:  Procedure Laterality Date  . HERNIA REPAIR      There were no vitals filed for this visit.  Subjective Assessment - 09/13/17 0911    Subjective  Pt reports his wife states he "mumbles".    Currently in Pain?  No/denies         SLP Evaluation [facility] - 09/13/17 0911      SLP Visit Information   SLP Received On  09/12/17    Referring Provider  Hortencia Conradi., MD    Onset Date  diagnosed approx 1-2 years ago    Medical Diagnosis  Parkinsons disease      Subjective   Subjective  "I used to be real loud and now especially when I'm tired my wife says I mumble."    Patient/Family Stated Goal  "To raise my volume and not mumble."      Balance Screen   Has the patient fallen in the past 6 months  No Thanksgiving 2018      Prior Functional Status   Cognitive/Linguistic Baseline  Within functional limits      Cognition   Overall Cognitive Status  Within Functional Limits for tasks assessed      Auditory Comprehension   Overall Auditory Comprehension  Appears within functional limits for tasks assessed      Oral Motor/Sensory Function   Overall Oral Motor/Sensory Function  Impaired    Labial ROM  Reduced right;Reduced  left    Labial Symmetry  Within Functional Limits    Labial Strength  Reduced    Labial Coordination  Reduced    Lingual ROM  Within Functional Limits with cues    Lingual Symmetry  Within Functional Limits    Lingual Strength  Reduced    Lingual Coordination  Reduced    Velum  -- CNT due to hyperactive gag      Motor Speech   Overall Motor Speech  Impaired    Respiration  Impaired    Level of Impairment  Sentence    Phonation  Low vocal intensity conversation 10 minutes mid-upper 60s    Intelligibility  Intelligible    Motor Planning  Impaired mild dysfluency - rare    Level of Impairment  Phrase    Effective Techniques  Increased vocal intensity    Phonation  -- improved to low 70s dB with SLP cues - 4 minutes     Sustained /a/ regular volume mid 60s dB, 23 seconds. Max volume sustained /a/ mid 80s dB, 18 seconds - volume increased to low 70s dB in conversation after loud /a/, with SLP cues  to focus on the effort necessary to produce louder /a/.                 SLP Education - 09/13/17 0913    Education Details  results of eval, possible goals, NPF booklet on speech adn swallowing, benefits of early PT, OT, ST    Person(s) Educated  Patient    Methods  Explanation    Comprehension  Verbalized understanding       SLP Short Term Goals - 09/13/17 0920      SLP SHORT TERM GOAL #1   Title  Pt will generate loud /a/ with average of low 90s dB over 3 sessions    Time  4    Period  Weeks    Status  New      SLP SHORT TERM GOAL #2   Title  Pt will use speech volume average low 70sdB when responding with sentences 18/20 over two sessions    Time  4    Period  Weeks    Status  New       SLP Long Term Goals - 09/13/17 1610      SLP LONG TERM GOAL #1   Title  Pt will generate loud /a/ with average of low 90s dB over 7 sessions    Time  8    Period  Weeks or 17 sessions, for all LTGs    Status  New      SLP LONG TERM GOAL #2   Title  Pt will use speech  volume average low 70sdB in 10 minutes simple to mod compelx conversation over two sessions    Time  8    Period  Weeks    Status  New       Plan - 09/13/17 0914    Clinical Impression Statement  Pt presents today with lower than normal voice volume average mid to upper 60s dB in a short conversation; speech intelligibility was above 95% in this quiet environment today. Conversational intensity improved to low 70s dB in a short converastion when pt was told to focus on increasing loudness/effort level, suggesting pt is a good candidate for ST. Pt would benefit from skilled ST focusing on pt's goal of improving commnicative ability with family and in the community. 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 SLP suggested pt receive therapy closer to home at [facility] or consider skilled ST at [facility] before or after school hours. Pt will contact ST in 30 days to tell SLP his desire with skilled ST. Pt agrees if he does not contact SLP therapy is not desired at this time.    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 Parkinson's disease (HCC) 09/12/2016    Marlon Suleiman ,MS, CCC-SLP  09/13/2017, 9:23 AM  [facility] [address]  [place], [place], [address] Phone: [phone]   Fax:   [phone]  Name: Leonard Mercer MRN: [id number] Date of Birth: [birth year]

## 2017-10-13 NOTE — Therapy (Signed)
[facility] [address] [place], [place], [address] Phone: [phone]   Fax:  [phone]  Patient Details  Name: Leonard Mercer MRN: [id number] Date of Birth: 08/09/1955 Referring Provider:  Hortencia ConradiWillis, C.K., MD  Encounter Date: 10/13/2017  SLP kept pt's chart open for 30 days, as agreed by pt, and he has not contacted this clinic to schedule appointments. Therefore his chart will be d/c'd.  [facility] Inc[facility]INKE,Cain Fitzhenry ,MS, CCC-SLP  10/13/2017, 8:05 AM  [facility] [address] [place], [place], [address] Phone: [phone]   Fax:  [phone]

## 2017-11-26 DIAGNOSIS — R5383 Other fatigue: Secondary | ICD-10-CM | POA: Insufficient documentation

## 2017-12-25 ENCOUNTER — Encounter: Payer: Self-pay | Admitting: Neurology

## 2017-12-25 ENCOUNTER — Other Ambulatory Visit: Payer: Self-pay

## 2017-12-25 ENCOUNTER — Ambulatory Visit: Payer: BC Managed Care – PPO | Admitting: Neurology

## 2017-12-25 VITALS — BP 115/77 | HR 79 | Resp 18 | Ht 71.0 in | Wt 167.0 lb

## 2017-12-25 DIAGNOSIS — G20 Parkinson's disease: Secondary | ICD-10-CM

## 2017-12-25 MED ORDER — CARBIDOPA-LEVODOPA 25-100 MG PO TABS: 1.5000 | ORAL_TABLET | Freq: Three times a day (TID) | ORAL | 1 refills | Status: DC

## 2017-12-25 NOTE — Progress Notes (Signed)
Reason for visit: Parkinson's disease  Leonard Mercer is an 62 y.o. male  History of present illness:  Leonard Mercer is a 62 year old right-handed white male with a history of Parkinson's disease.  The patient has undergone some speech therapy for his hypophonia, but he did not continue this.  The patient works as a Runner, broadcasting/film/video, he finds that this offers a lot of stress in his life.  The patient however has poor sleep hygiene, he will come home from work and take a restless nap in the evening, get up and play video games until around 11 or 12 PM and then have to get up at 4:45 AM the next morning.  The patient is on Mirapex taking 0.75 mg 3 times daily.  The patient is on Sinemet taking 25/100 mg 3 times daily.  The patient is feeling somewhat imbalanced with his walking, he has not had any falls, he does not use a cane.  He comes to this office for an evaluation.  Past Medical History:  Diagnosis Date  . Chest pain   . Hypertension   . Kidney stone   . Parkinson's disease (HCC) 09/12/2016    Past Surgical History:  Procedure Laterality Date  . HERNIA REPAIR      Family History  Problem Relation Age of Onset  . Hypertension Mother   . Diabetes Father   . Heart disease Maternal Grandfather     Social history:  reports that he has never smoked. He has never used smokeless tobacco. He reports that he drinks alcohol. He reports that he does not use drugs.    Allergies  Allergen Reactions  . Ace Inhibitors Other (See Comments)    unknown    Medications:  Prior to Admission medications   Medication Sig Start Date End Date Taking? Authorizing Provider  carbidopa-levodopa (SINEMET IR) 25-100 MG tablet Take 1.5 tablets by mouth 3 (three) times daily. 12/25/17  Yes [name], MD  losartan (COZAAR) 50 MG tablet Take 50 mg by mouth daily. 09/01/16  Yes [provider]  metFORMIN (GLUCOPHAGE) 1000 MG tablet Take 1,000 mg by mouth 2 (two) times daily with a meal.   Yes  [provider]  pramipexole (MIRAPEX) 0.75 MG tablet Take 1 tablet (0.75 mg total) by mouth 3 (three) times daily. 08/03/17  Yes [name], MD  pravastatin (PRAVACHOL) 20 MG tablet Take 20 mg by mouth daily.   Yes [provider]  [facility] injection inject 0.5 milliliter intramuscularly 07/02/16  Yes [provider]  Multiple Vitamin (MULTI-VITAMIN PO) Take by mouth daily.    [provider]    ROS:  Out of a complete 14 system review of symptoms, the patient complains only of the following symptoms, and all other reviewed systems are negative.  Tremor Sensation of imbalance  Blood pressure 115/77, pulse 79, resp. rate 18, height 5\' 11"  (1.803 m), weight 167 lb (75.8 kg).  Physical Exam  General: The patient is alert and cooperative at the time of the examination.  Skin: No significant peripheral edema is noted.   Neurologic Exam  Mental status: The patient is alert and oriented x 3 at the time of the examination. The patient has apparent normal recent and remote memory, with an apparently normal attention span and concentration ability.   Cranial nerves: Facial symmetry is present. Speech is normal, no aphasia or dysarthria is noted. Extraocular movements are full. Visual fields are full.  Masking of the face is seen.  Motor: The patient has good strength in all 4 extremities.  Sensory examination: Soft touch sensation is symmetric on the face, arms, and legs.  Coordination: The patient has good finger-nose-finger and heel-to-shin bilaterally.  Resting tremors are noted on the upper extremities.  Gait and station: The patient has the ability to stand from a seated position with arms crossed with some difficulty.  Once up, he is able to walk independently, decreased arm swing is seen bilaterally. Tandem gait is slightly unsteady. Romberg is negative. No drift is seen.  Reflexes: Deep tendon reflexes are  symmetric.   Assessment/Plan:  1.  Parkinson's disease  2.  Mild gait instability  3.  Hypophonia  The patient will be increased on the Sinemet taking the 25/100 mg tablets, taking 1.5 tablets 3 times daily.  He will remain on the Mirapex.  We will likely increase this dose on the next visit.  The patient will continue to remain active, I have urged him to have better sleep hygiene, not take a nap in the early evening but go to bed earlier, around 9 PM.  He will follow-up in 6 months.  A prescription was sent in for the Sinemet.  Marlan Palau MD 12/25/2017 9:52 AM  [facility] [address] [place], [place] [address]  Phone [phone] Fax [phone]

## 2017-12-25 NOTE — Patient Instructions (Signed)
We will go up on the sinemet 25/100 mg tablets to 1.5 tablets three times a day.  Sinemet (carbidopa) may result in confusion or hallucinations, drowsiness, nausea, or dizziness. If any significant side effects are noted, please contact our office. Sinemet may not be well absorbed when taken with high protein meals, if tolerated it is best to take 30-45 minutes before you eat.  

## 2018-01-29 ENCOUNTER — Telehealth: Payer: Self-pay | Admitting: Neurology

## 2018-01-29 MED ORDER — PRAMIPEXOLE DIHYDROCHLORIDE 0.75 MG PO TABS: 0.7500 mg | ORAL_TABLET | Freq: Three times a day (TID) | ORAL | 1 refills | Status: DC

## 2018-01-29 NOTE — Telephone Encounter (Signed)
Refill submitted electronically to [facility]. MB RN.

## 2018-01-29 NOTE — Telephone Encounter (Signed)
Pt requesting refills for pramipexole (MIRAPEX) [facility]

## 2018-08-08 ENCOUNTER — Ambulatory Visit: Payer: BC Managed Care – PPO | Admitting: Neurology

## 2018-09-03 ENCOUNTER — Ambulatory Visit: Payer: BC Managed Care – PPO | Admitting: Neurology

## 2018-09-03 ENCOUNTER — Other Ambulatory Visit: Payer: Self-pay

## 2018-09-03 ENCOUNTER — Encounter: Payer: Self-pay | Admitting: Neurology

## 2018-09-03 VITALS — BP 115/77 | HR 65 | Temp 97.8°F | Ht 71.0 in | Wt 165.0 lb

## 2018-09-03 DIAGNOSIS — G20 Parkinson's disease: Secondary | ICD-10-CM

## 2018-09-03 DIAGNOSIS — G20A1 Parkinson's disease without dyskinesia, without mention of fluctuations: Secondary | ICD-10-CM

## 2018-09-03 MED ORDER — PRAMIPEXOLE DIHYDROCHLORIDE 1 MG PO TABS: 1.0000 mg | ORAL_TABLET | Freq: Three times a day (TID) | ORAL | 1 refills | Status: DC

## 2018-09-03 MED ORDER — CARBIDOPA-LEVODOPA 25-100 MG PO TABS: 2.0000 | ORAL_TABLET | Freq: Three times a day (TID) | ORAL | 1 refills | Status: DC

## 2018-09-03 NOTE — Patient Instructions (Signed)
We will increase the Sinemet 25/100 (carbidopa) to 2 tablets three times a day, also increase the Mirapex to 1 mg three times a day.  Sinemet (carbidopa) may result in confusion or hallucinations, drowsiness, nausea, or dizziness. If any significant side effects are noted, please contact our office. Sinemet may not be well absorbed when taken with high protein meals, if tolerated it is best to take 30-45 minutes before you eat.

## 2018-09-03 NOTE — Progress Notes (Signed)
Reason for visit: Parkinson's disease  Drue FlirtDudley C Foxworth is an 63 y.o. male  History of present illness:  [name] is a 63 year old right-handed white male with a history of Parkinson's disease primarily with right-sided features.  The patient works as a Runner, broadcasting/film/videoteacher, he Consulting civil engineerteaches art.  He has been doing most of his teaching lately through the Internet.  The patient is finding it increasingly difficult to control his hands, he does have a right-sided tremor but his hands are clumsy.  He has noted some slight balance troubles, if he walks on uneven ground he needs to use a cane or a walking stick.  He has not had any falls.  His wife has noted some changes in cognitive functioning, his general memory is fairly good but he is developing some word finding problems and a stuttering speech pattern at times.  His cognitive processing is slower.  The patient also reports some occasional trouble swallowing solids, not liquids.  He has started to have some problems with drooling.  He has had hypophonia as well.  He was to go up on his Sinemet taking 1.5 tablets 3 times a day but he has not been good about this, he has been taking oftentimes only 1 tablet 3 times a day, essentially not changing his medication dosing.  He has progressed some with his Parkinson's over time.  Past Medical History:  Diagnosis Date  . Chest pain   . Hypertension   . Kidney stone   . Parkinson's disease ([facility]) 09/12/2016    Past Surgical History:  Procedure Laterality Date  . HERNIA REPAIR      Family History  Problem Relation Age of Onset  . Hypertension Mother   . Diabetes Father   . Heart disease Maternal Grandfather     Social history:  reports that he has never smoked. He has never used smokeless tobacco. He reports current alcohol use. He reports that he does not use drugs.    Allergies  Allergen Reactions  . Ace Inhibitors Other (See Comments)    unknown    Medications:  Prior to Admission medications    Medication Sig Start Date End Date Taking? Authorizing Provider  carbidopa-levodopa (SINEMET IR) 25-100 MG tablet Take 1.5 tablets by mouth 3 (three) times daily. 12/25/17  Yes York SpanielWillis, Charles K, MD  losartan (COZAAR) 50 MG tablet Take 50 mg by mouth daily. 09/01/16  Yes [provider]  metFORMIN (GLUCOPHAGE) 1000 MG tablet Take 1,000 mg by mouth 2 (two) times daily with a meal.   Yes [provider]  Multiple Vitamin (MULTI-VITAMIN PO) Take by mouth daily.   Yes [provider]  pramipexole (MIRAPEX) 0.75 MG tablet Take 1 tablet (0.75 mg total) by mouth 3 (three) times daily. 01/29/18  Yes York SpanielWillis, Charles K, MD  pravastatin (PRAVACHOL) 20 MG tablet Take 20 mg by mouth daily.   Yes [provider]  [facility] injection inject 0.5 milliliter intramuscularly 07/02/16  Yes [provider]    ROS:  Out of a complete 14 system review of symptoms, the patient complains only of the following symptoms, and all other reviewed systems are negative.  Tremor Drooling Word finding problems  Blood pressure 115/77, pulse 65, temperature 97.8 F (36.6 C), temperature source Temporal, height 5\' 11"  (1.803 m), weight 165 lb (74.8 kg).  Physical Exam  General: The patient is alert and cooperative at the time of the examination.  Respiratory: Lung fields are clear.  Cardiovascular: Regular rate and rhythm,  no murmurs or rubs are noted.  Skin: No significant peripheral edema is noted.   Neurologic Exam  Mental status: The patient is alert and oriented x 3 at the time of the examination. The Mini-Mental status examination shows a total score of 23/30.   Cranial nerves: Facial symmetry is present. Speech is normal, no aphasia or dysarthria is noted. Extraocular movements are full. Visual fields are full.  Prominent masking of the face is seen.  Motor: The patient has good strength in all 4 extremities.  Sensory examination: Soft touch sensation is  symmetric on the face, arms, and legs.  Coordination: The patient has good finger-nose-finger and heel-to-shin bilaterally.  A resting tremor is noted on the right upper extremity.  Gait and station: The patient is able to arise from a seated position with arms crossed.  Once up, he is able to ambulate independently, he has relatively good stride and turns but he has decreased arm swing on the right with tremor in the right arm with walking.  Tandem gait is unsteady.  Romberg is negative.  No drift is seen.  Reflexes: Deep tendon reflexes are symmetric.   Assessment/Plan:  1.  Parkinson's disease  2.  Slowed cognitive functioning, word finding problems  3.  Gait disorder  We will follow the memory issues over time.  The patient will go up on the Sinemet taking 2 tablets 3 times daily, we will increase the Mirapex to 1 mg 3 times daily.  A prescription for these medications was sent in.  He will follow-up in 5 months, they will contact me if he is not functioning well over the next couple months, we may continue to increase his Sinemet before we see him back.  The patient did not do well on the Mini-Mental status examination, we may consider adding Aricept on the next visit.  Jill Alexanders MD 09/03/2018 8:01 AM  [facility] [address] [place], [place] [address]  Phone [phone] Fax [phone]

## 2018-10-15 ENCOUNTER — Telehealth: Payer: Self-pay | Admitting: Neurology

## 2018-10-15 ENCOUNTER — Encounter: Payer: Self-pay | Admitting: Neurology

## 2018-10-15 NOTE — Telephone Encounter (Signed)
Pt is asking for a letter re: disability.  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  Please call to discuss further (please call after 2:30)

## 2018-10-15 NOTE — Telephone Encounter (Signed)
I called the patient.  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  I will dictate a letter in this regard.  He scored 23/30 on Mini-Mental Status Examination on the last visit.

## 2018-10-16 NOTE — Telephone Encounter (Signed)
I contacted the pt and left a vm advising letter is ready for pick up. Pt was advised he could come by Monday- Thursday to pick letter up 8 to 430 pm.

## 2018-10-29 ENCOUNTER — Telehealth: Payer: Self-pay | Admitting: *Deleted

## 2018-10-29 NOTE — Telephone Encounter (Signed)
Will wait for payment before processing.

## 2018-10-29 NOTE — Telephone Encounter (Signed)
R/c FMLA form. Fee is due for the form.

## 2018-10-29 NOTE — Telephone Encounter (Signed)
Pt fmla form on your desk. R/c today I was waiting on pt payment.

## 2018-10-29 NOTE — Telephone Encounter (Signed)
Pt called stating he fax some time sensitive disability forms from his job. It should have a cover letter attach to it.He wanted to know if Dr. Jannifer Franklin receive it. His contact number is [phone].

## 2018-10-30 ENCOUNTER — Telehealth: Payer: Self-pay

## 2018-10-30 DIAGNOSIS — Z0289 Encounter for other administrative examinations: Secondary | ICD-10-CM

## 2018-10-30 NOTE — Telephone Encounter (Signed)
Payment for forms was received today. I have completed and placed in MD's office for him to review/sign if appropriate.

## 2018-10-30 NOTE — Telephone Encounter (Signed)
Error

## 2018-10-30 NOTE — Telephone Encounter (Signed)
Pt called and was transferred to billing to pay FMLA form fee.

## 2018-10-31 NOTE — Telephone Encounter (Signed)
Dr. Jannifer Franklin has completed/signed the form. I have fwd back to medical records for processing.

## 2018-11-01 ENCOUNTER — Telehealth: Payer: Self-pay | Admitting: *Deleted

## 2018-11-01 NOTE — Telephone Encounter (Signed)
Pt FMLA form fax to [facility] [employer] attn [employer] [phone]

## 2018-11-05 ENCOUNTER — Telehealth: Payer: Self-pay

## 2018-11-05 NOTE — Telephone Encounter (Signed)
I contacted the pt's wife back and advised we could see him on 11/07/18 at 930 check in for a 10 appt. This appt would be with Dr Jannifer Franklin

## 2018-11-05 NOTE — Telephone Encounter (Signed)
If he cannot to work in my schedule soon, okay to see nurse practitioner, this is a justified issue with FMLA and ultimately leading to permanent disability.  The patient has a dementia associated with Parkinson's disease, unable to function as a Pharmacist, hospital.

## 2018-11-05 NOTE — Telephone Encounter (Signed)
Pt's wife called in regards to disability paper work. Pt has ran into an issue with his disability forms.  Pt's employer is not able to process paper work since the recommendation to stay out of work came through a telephone encounter on 10/15/2018 and not an office visit. Pt is needing to be seen in the office by 11/08/2018 so paper work can be processed. Right now we do not have any openings but I advised I would check with Dr. Jannifer Franklin and see if he would be in agreement to the pt seeing on of his NP's and then signing the disability paper work again or if he could addend the 09/03/2018 note. Wife was agreeable to either.

## 2018-11-07 ENCOUNTER — Ambulatory Visit: Payer: BC Managed Care – PPO | Admitting: Neurology

## 2018-11-07 ENCOUNTER — Encounter: Payer: Self-pay | Admitting: Neurology

## 2018-11-07 ENCOUNTER — Other Ambulatory Visit: Payer: Self-pay

## 2018-11-07 VITALS — BP 112/75 | HR 66 | Temp 97.7°F | Ht 71.0 in | Wt 161.3 lb

## 2018-11-07 DIAGNOSIS — R413 Other amnesia: Secondary | ICD-10-CM | POA: Insufficient documentation

## 2018-11-07 DIAGNOSIS — G20 Parkinson's disease: Secondary | ICD-10-CM | POA: Diagnosis not present

## 2018-11-07 HISTORY — DX: Other amnesia: R41.3

## 2018-11-07 MED ORDER — DONEPEZIL HCL 5 MG PO TABS: 5.0000 mg | ORAL_TABLET | Freq: Every day | ORAL | 1 refills | Status: DC

## 2018-11-07 MED ORDER — CARBIDOPA-LEVODOPA 25-250 MG PO TABS: 1.0000 | ORAL_TABLET | Freq: Three times a day (TID) | ORAL | 1 refills | Status: DC

## 2018-11-07 NOTE — Patient Instructions (Signed)
We will start Sinemet 25/250 mg tablet three times a day.  Sinemet (carbidopa) may result in confusion or hallucinations, drowsiness, nausea, or dizziness. If any significant side effects are noted, please contact our office. Sinemet may not be well absorbed when taken with high protein meals, if tolerated it is best to take 30-45 minutes before you eat.   We will start aricept for the memory.  Begin Aricept (donepezil) at 5 mg at night for one month. If this medication is well-tolerated, please call our office and we will call in a prescription for the 10 mg tablets. Look out for side effects that may include nausea, diarrhea, weight loss, or stomach cramps. This medication will also cause a runny nose, therefore there is no need for allergy medications for this purpose.

## 2018-11-07 NOTE — Progress Notes (Signed)
Reason for visit: Parkinson's disease, dementia  Leonard Mercer is an 63 y.o. male  History of present illness:  Leonard Mercer is a 63 year old right-handed white male with a history of Parkinson's disease.  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  He will be going out on FMLA and then convert to long term disability in the future.  His last day of work was on 12 October 2018.  The patient was recently increased on the Sinemet, he initially got benefit but then the benefit has worn off.  He is getting 2 of the 25/100 mg Sinemet tablets 3 times daily.  He is also on Mirapex.  He denies any hallucinations, he will sometimes feel as if he is being watched.  He may have some occasional vivid dreams at night.  He denies any falls, but he tends to shuffle his feet and he may stumble on occasion because of this.  He denies any problems with choking with swallowing.  Past Medical History:  Diagnosis Date  . Chest pain   . Hypertension   . Kidney stone   . Parkinson's disease ([place]) 09/12/2016    Past Surgical History:  Procedure Laterality Date  . HERNIA REPAIR      Family History  Problem Relation Age of Onset  . Hypertension Mother   . Diabetes Father   . Heart disease Maternal Grandfather     Social history:  reports that he has never smoked. He has never used smokeless tobacco. He reports current alcohol use. He reports that he does not use drugs.    Allergies  Allergen Reactions  . Ace Inhibitors Other (See Comments)    unknown    Medications:  Prior to Admission medications   Medication Sig Start Date End Date Taking? Authorizing Provider  carbidopa-levodopa (SINEMET IR) 25-100 MG tablet Take 2 tablets by mouth 3 (three) times daily. 09/03/18  Yes Kathrynn Ducking, MD  losartan (COZAAR) 50 MG tablet Take 50 mg by mouth daily. 09/01/16  Yes [provider]  metFORMIN (GLUCOPHAGE) 1000 MG tablet Take 1,000 mg by mouth 2 (two) times daily with a meal.   Yes [provider]  Multiple Vitamin (MULTI-VITAMIN PO) Take by mouth daily.   Yes [provider]  pramipexole (MIRAPEX) 1 MG tablet Take 1 tablet (1 mg total) by mouth 3 (three) times daily. 09/03/18  Yes Kathrynn Ducking, MD  pravastatin (PRAVACHOL) 20 MG tablet Take 20 mg by mouth daily.   Yes [provider]    ROS:  Out of a complete 14 system review of symptoms, the patient complains only of the following symptoms, and all other reviewed systems are negative.  Memory problems Walking difficulty Tremors  Blood pressure 112/75, pulse 66, temperature 97.7 F (36.5 C), temperature source Temporal, height 5\' 11"  (1.803 m), weight 161 lb 5 oz (73.2 kg).  Physical Exam  General: The patient is alert and cooperative at the time of the examination.  Skin: No significant peripheral edema is noted.   Neurologic Exam  Mental status: The patient is alert and oriented x 3 at the time of the examination. The patient has apparent normal recent and remote memory, with an apparently normal attention span and concentration ability.   Cranial nerves: Facial symmetry is present. Speech is normal, no aphasia or dysarthria is noted. Extraocular movements are full. Visual  fields are full.  Masking the face is seen.  Motor: The patient has good strength in all 4 extremities.  Sensory examination: Soft touch sensation is symmetric on the face, arms, and legs.  Coordination: The patient has good finger-nose-finger and heel-to-shin bilaterally.  A right upper extremity resting tremor is noted.  Gait and station: The patient has some difficulty arising from a seated position, but eventually he can do this.  Once up, he can walk without assistance, he does tend to shuffle his feet, decreased arm swing is seen on the right.  Tandem gait is very unsteady, he is  unable to perform this.  Romberg is positive, he will lean to the right.  Reflexes: Deep tendon reflexes are symmetric.   Assessment/Plan:  1.  Parkinson's disease  2.  Gait disorder  3.  Memory disorder, dementia  The patient appropriately will need to go on disability, he is no longer able to work.  The patient will be increased on Sinemet taking the 25/250 mg tablets, 1 tablet 3 times daily.  We will go on Aricept taking 5 mg at night, they will call for dose adjustments.  Physical therapy was recommended for the patient for walking, he will call me if he desires to go on this therapy.  He will follow-up for his next scheduled visit in December 2020.  Marlan Palau MD 11/07/2018 9:59 AM  [facility] [address] [place], [place] [address]  Phone [phone] Fax [phone]

## 2018-11-08 NOTE — Telephone Encounter (Signed)
Pt fmla form re faxed today.

## 2018-11-08 NOTE — Telephone Encounter (Signed)
Pt called wanting to know if his FMLA paperwork has been corrected and sent out. Please advise.

## 2018-11-14 ENCOUNTER — Telehealth: Payer: Self-pay | Admitting: Neurology

## 2018-11-14 MED ORDER — MEMANTINE HCL 5 MG PO TABS: ORAL_TABLET | ORAL | 0 refills | Status: DC

## 2018-11-14 NOTE — Telephone Encounter (Signed)
Pt called stating that ever since his medications were changed to a higher dosage his stomach has been hurting and he would like to know if something can be done about that or if he has to "deal with it." Please advise.

## 2018-11-14 NOTE — Telephone Encounter (Signed)
I called the patient.  The patient has had trouble with stomach upset on the Aricept, he is only on 5 mg at night.  He will stop the Aricept, I will start Namenda after the stomach problem improves.  I will send in a prescription.

## 2018-11-14 NOTE — Telephone Encounter (Signed)
I reached out to the pt. He reports stomach pains since starting his increased sinemet dosage and the aricept dosage. Pt would like to know if MD thinks his stomach pain will resolve after the medications longer or is this a side effect he will have to deal with consistently.  I advised the pt the stomach pain could be coming from the aricept and I would let Dr. Jannifer Franklin know. Pt states he has not had any diarrhea since starting the med.

## 2018-12-26 ENCOUNTER — Telehealth: Payer: Self-pay | Admitting: Neurology

## 2018-12-26 NOTE — Telephone Encounter (Signed)
LVM asking for a call back

## 2018-12-26 NOTE — Telephone Encounter (Signed)
Pt called wanting to inform provider that ever since his dosages on his medications he has been nauseated and is wanting to discuss with RN or Provider. Please advise.

## 2018-12-27 ENCOUNTER — Other Ambulatory Visit: Payer: Self-pay | Admitting: Neurology

## 2018-12-27 NOTE — Telephone Encounter (Signed)
Pt called to report since increasing the carbidopa/levodopa to 25-250 mg tid he has been experiencing nausea.  Pt reports he has been taking this medication with food.  I advised per instructions from Dr. Jannifer Franklin on 11/07/2018 to try and taking the medication 30-45 minutes before his meals and see if this helps with the nausea. Pt was agreeable/appreative for the call. Pt will call back to let us know how this works for him.

## 2018-12-27 NOTE — Telephone Encounter (Signed)
Lvm asking for a call back

## 2018-12-31 ENCOUNTER — Telehealth: Payer: Self-pay | Admitting: Neurology

## 2018-12-31 MED ORDER — MEMANTINE HCL 5 MG PO TABS: ORAL_TABLET | ORAL | 0 refills | Status: DC

## 2018-12-31 NOTE — Telephone Encounter (Signed)
Pt called stating that his pharmacy is still waiting for his memantine (NAMENDA) 5 MG tablet prescription. Please advise.

## 2018-12-31 NOTE — Telephone Encounter (Signed)
Rx has bee re sent to [facility] in [place].

## 2019-01-23 ENCOUNTER — Telehealth: Payer: Self-pay

## 2019-01-23 NOTE — Telephone Encounter (Signed)
Pt will be on long term disability due to Parkinsons disease.Pt paid fee and form completed. Return to [place] in medical records for processing.

## 2019-01-24 ENCOUNTER — Other Ambulatory Visit: Payer: Self-pay

## 2019-01-24 ENCOUNTER — Encounter: Payer: Self-pay | Admitting: Neurology

## 2019-01-24 ENCOUNTER — Ambulatory Visit: Payer: BC Managed Care – PPO | Admitting: Neurology

## 2019-01-24 VITALS — BP 126/75 | HR 67 | Temp 97.9°F | Ht 71.0 in | Wt 166.0 lb

## 2019-01-24 DIAGNOSIS — G20 Parkinson's disease: Secondary | ICD-10-CM | POA: Diagnosis not present

## 2019-01-24 DIAGNOSIS — R413 Other amnesia: Secondary | ICD-10-CM | POA: Diagnosis not present

## 2019-01-24 MED ORDER — MEMANTINE HCL 10 MG PO TABS: 10.0000 mg | ORAL_TABLET | Freq: Two times a day (BID) | ORAL | 3 refills | Status: DC

## 2019-01-24 NOTE — Progress Notes (Signed)
Reason for visit: Parkinson's disease  Leonard Mercer is an 63 y.o. male  History of present illness:  Leonard Mercer is a 63 year old right-handed white male with a history of Parkinson's disease.  He is now going on disability for this reason, he worked as a Pharmacist, hospital.  The patient has had an increase in his Sinemet dosing to the 25/250 mg tablets taking 1 tablet 3 times daily, initially he had some nausea with this but he is doing better at this time.  He is now on Namenda for his memory issues, he could not tolerate Aricept.  He has not had any falls, he does have some drowsiness if he is still during the day, he will fall asleep.  He drives a car without difficulty, he seems to sleep fairly well at night, he does not act out his dreams are talk in his sleep.  The patient returns to the office today for an evaluation.  Past Medical History:  Diagnosis Date  . Chest pain   . Hypertension   . Kidney stone   . Memory disorder 11/07/2018  . Parkinson's disease ([place]) 09/12/2016    Past Surgical History:  Procedure Laterality Date  . HERNIA REPAIR      Family History  Problem Relation Age of Onset  . Hypertension Mother   . Diabetes Father   . Heart disease Maternal Grandfather     Social history:  reports that he has never smoked. He has never used smokeless tobacco. He reports current alcohol use. He reports that he does not use drugs.    Allergies  Allergen Reactions  . Ace Inhibitors Other (See Comments)    unknown  . Aricept [Donepezil Hcl]     Stomach upset    Medications:  Prior to Admission medications   Medication Sig Start Date End Date Taking? Authorizing Provider  carbidopa-levodopa (SINEMET) 25-250 MG tablet Take 1 tablet by mouth 3 (three) times daily. 11/07/18  Yes Kathrynn Ducking, MD  losartan (COZAAR) 50 MG tablet Take 50 mg by mouth daily. 09/01/16  Yes [provider]  memantine (NAMENDA) 5 MG tablet Take 1 tablet daily for one week, then take 1  tablet twice daily for one week, then take 1 tablet in the morning and 2 in the evening for one week, then take 2 tablets twice daily 12/31/18  Yes Kathrynn Ducking, MD  metFORMIN (GLUCOPHAGE) 1000 MG tablet Take 1,000 mg by mouth 2 (two) times daily with a meal.   Yes [provider]  Multiple Vitamin (MULTI-VITAMIN PO) Take by mouth daily.   Yes [provider]  pramipexole (MIRAPEX) 1 MG tablet Take 1 tablet (1 mg total) by mouth 3 (three) times daily. 09/03/18  Yes Kathrynn Ducking, MD  pravastatin (PRAVACHOL) 20 MG tablet Take 20 mg by mouth daily.   Yes [provider]    ROS:  Out of a complete 14 system review of symptoms, the patient complains only of the following symptoms, and all other reviewed systems are negative.  Walking difficulty Tremors Memory problems  Blood pressure 126/75, pulse 67, temperature 97.9 F (36.6 C), temperature source Temporal, height 5\' 11"  (1.803 m), weight 166 lb (75.3 kg).  Physical Exam  General: The patient is alert and cooperative at the time of the examination.  Skin: No significant peripheral edema is noted.   Neurologic Exam  Mental status: The patient is alert and oriented x 3 at the time of the examination. The Mini-Mental  status examination done today shows a total score 24/30.   Cranial nerves: Facial symmetry is present. Speech is normal, no aphasia or dysarthria is noted. Extraocular movements are full. Visual fields are full.  Masking of the face is seen.  Motor: The patient has good strength in all 4 extremities.  Sensory examination: Soft touch sensation is symmetric on the face, arms, and legs.  Coordination: The patient has good finger-nose-finger and heel-to-shin bilaterally.  Gait and station: The patient is able to arise from a seated position with arms crossed with some difficulty.  Once up, he is able to ambulate independently, he has a stooped posture, decreased arm swing is significant on  both sides.  He is able to turn with relative ease.  Tandem gait is slightly unsteady.  Romberg is negative but is slightly unsteady.  Reflexes: Deep tendon reflexes are symmetric.   Assessment/Plan:  1.  Parkinson's disease  2.  Gait disorder  3.  Memory disorder  The patient will be switched over to the 10 mg Namenda tablets taking 1 twice daily, a prescription was sent in.  The patient be followed for the memory issues, he will follow-up here in 5 months.  We will not change the Sinemet or Mirapex dosing.  Marlan Palau MD 01/24/2019 4:18 PM  [facility] [address] [place], [place] [address]  Phone [phone] Fax [phone]

## 2019-04-09 ENCOUNTER — Telehealth: Payer: Self-pay | Admitting: Neurology

## 2019-04-09 NOTE — Telephone Encounter (Signed)
I called pt about being off his medications due to having the COVID 19 virus recently. Pt stated he had high fever and had no appetite.i stated to restart medications asap and take with food or snack. Pt verbalized understanding.

## 2019-04-09 NOTE — Telephone Encounter (Signed)
Pt called needing to speak to the provider about his medications and how to get back on them since he had to get off of it due to having the virus. Please advise.

## 2019-05-24 ENCOUNTER — Ambulatory Visit: Payer: BC Managed Care – PPO | Attending: Internal Medicine

## 2019-05-24 DIAGNOSIS — Z23 Encounter for immunization: Secondary | ICD-10-CM

## 2019-05-24 NOTE — Progress Notes (Signed)
   Covid-19 Vaccination Clinic  Name:  Leonard Mercer    MRN: [id number] DOB: [birth year]  05/24/2019  [name] was observed post Covid-19 immunization for 15 minutes without incident. He was provided with Vaccine Information Sheet and instruction to access the V-Safe system.   [name] was instructed to call 911 with any severe reactions post vaccine: Marland Kitchen Difficulty breathing  . Swelling of face and throat  . A fast heartbeat  . A bad rash all over body  . Dizziness and weakness   Immunizations Administered    Name Date Dose VIS Date Route   Moderna COVID-19 Vaccine 05/24/2019  9:29 AM 0.5 mL 01/15/2019 Intramuscular   Manufacturer: Moderna   Lot: [id number]   NDC: 98338-250-53

## 2019-06-21 ENCOUNTER — Ambulatory Visit: Payer: BC Managed Care – PPO | Attending: Internal Medicine

## 2019-06-21 DIAGNOSIS — Z23 Encounter for immunization: Secondary | ICD-10-CM

## 2019-06-21 NOTE — Progress Notes (Signed)
   Covid-19 Vaccination Clinic  Name:  Leonard Mercer    MRN: [id number] DOB: [birth year]  06/21/2019  [name] was observed post Covid-19 immunization for 15 minutes without incident. He was provided with Vaccine Information Sheet and instruction to access the V-Safe system.   [name] was instructed to call 911 with any severe reactions post vaccine: Marland Kitchen Difficulty breathing  . Swelling of face and throat  . A fast heartbeat  . A bad rash all over body  . Dizziness and weakness   Immunizations Administered    Name Date Dose VIS Date Route   Moderna COVID-19 Vaccine 06/21/2019 12:21 PM 0.5 mL 01/2019 Intramuscular   Manufacturer: Moderna   Lot: [id number]   NDC: 17981-025-48

## 2019-06-25 ENCOUNTER — Ambulatory Visit: Payer: BC Managed Care – PPO

## 2019-06-26 ENCOUNTER — Telehealth: Payer: Self-pay

## 2019-06-26 ENCOUNTER — Ambulatory Visit: Payer: BC Managed Care – PPO | Admitting: Neurology

## 2019-06-26 ENCOUNTER — Other Ambulatory Visit: Payer: Self-pay

## 2019-06-26 ENCOUNTER — Encounter: Payer: Self-pay | Admitting: Neurology

## 2019-06-26 VITALS — BP 108/71 | HR 79 | Temp 97.9°F | Ht 70.0 in | Wt 163.5 lb

## 2019-06-26 DIAGNOSIS — R413 Other amnesia: Secondary | ICD-10-CM

## 2019-06-26 DIAGNOSIS — G20 Parkinson's disease: Secondary | ICD-10-CM

## 2019-06-26 MED ORDER — PRAMIPEXOLE DIHYDROCHLORIDE 1 MG PO TABS: 1.0000 mg | ORAL_TABLET | Freq: Three times a day (TID) | ORAL | 1 refills | Status: DC

## 2019-06-26 MED ORDER — CARBIDOPA-LEVODOPA 25-250 MG PO TABS: 1.0000 | ORAL_TABLET | Freq: Three times a day (TID) | ORAL | 1 refills | Status: DC

## 2019-06-26 MED ORDER — CARBIDOPA-LEVODOPA 25-100 MG PO TABS: 0.5000 | ORAL_TABLET | Freq: Three times a day (TID) | ORAL | 2 refills | Status: DC

## 2019-06-26 NOTE — Patient Instructions (Signed)
We will add 1/2 of a 25/100 mg sinemet tablet with each dose of the 25/250 mg Sinemet tablet.  Sinemet (carbidopa) may result in confusion or hallucinations, drowsiness, nausea, or dizziness. If any significant side effects are noted, please contact our office. Sinemet may not be well absorbed when taken with high protein meals, if tolerated it is best to take 30-45 minutes before you eat.

## 2019-06-26 NOTE — Progress Notes (Signed)
Reason for visit: Parkinson's disease, memory disturbance  Leonard Mercer is an 64 y.o. male  History of present illness:  Leonard Mercer is a 64 year old right-handed white male with a history of Parkinson's disease.  The patient is on Sinemet taking 25/250 mg tablets, 1 tablet 3 times daily.  He takes Mirapex 1 tablet of the 1 mg 3 times daily.  He has had some issues with balance, he has had 1 fall since last seen, he denies any dizziness.  He is sleeping well at night.  He still has some memory issues but he believes that this has remained stable.  The patient is on Namenda for the memory.  He is trying to remain active, he is fishing a lot currently.  He wears a LifeVest when he is on the boat.  He comes to this office for an evaluation.  He has difficulty buttoning buttons.  He does pottery.  Past Medical History:  Diagnosis Date  . Chest pain   . Hypertension   . Kidney stone   . Memory disorder 11/07/2018  . Parkinson's disease (HCC) 09/12/2016    Past Surgical History:  Procedure Laterality Date  . HERNIA REPAIR      Family History  Problem Relation Age of Onset  . Hypertension Mother   . Diabetes Father   . Heart disease Maternal Grandfather     Social history:  reports that he has never smoked. He has never used smokeless tobacco. He reports current alcohol use. He reports that he does not use drugs.    Allergies  Allergen Reactions  . Ace Inhibitors Other (See Comments)    unknown  . Donepezil Hcl Other (See Comments)    Stomach upset Stomach upset  . Pravastatin Rash    Medications:  Prior to Admission medications   Medication Sig Start Date End Date Taking? Authorizing Provider  albuterol (VENTOLIN HFA) 108 (90 Base) MCG/ACT inhaler Inhale 2 puffs into the lungs every 4 (four) hours. 04/25/19  Yes [provider]  carbidopa-levodopa (SINEMET) 25-250 MG tablet Take 1 tablet by mouth 3 (three) times daily. 11/07/18  Yes Leonard Spaniel, MD  losartan  (COZAAR) 50 MG tablet Take 50 mg by mouth daily. 09/01/16  Yes [provider]  memantine (NAMENDA) 10 MG tablet Take 1 tablet (10 mg total) by mouth 2 (two) times daily. 01/24/19  Yes Leonard Spaniel, MD  metFORMIN (GLUCOPHAGE) 1000 MG tablet Take 1,000 mg by mouth 2 (two) times daily with a meal.   Yes [provider]  pramipexole (MIRAPEX) 1 MG tablet Take 1 tablet (1 mg total) by mouth 3 (three) times daily. 09/03/18  Yes Leonard Spaniel, MD  pravastatin (PRAVACHOL) 20 MG tablet Take 20 mg by mouth daily.   Yes [provider]    ROS:  Out of a complete 14 system review of symptoms, the patient complains only of the following symptoms, and all other reviewed systems are negative.  Balance problems Difficulty with memory Fine motor control problems with hands  Blood pressure 108/71, pulse 79, temperature 97.9 F (36.6 C), height 5\' 10"  (1.778 m), weight 163 lb 8 oz (74.2 kg).  Physical Exam  General: The patient is alert and cooperative at the time of the examination.  Skin: No significant peripheral edema is noted.   Neurologic Exam  Mental status: The patient is alert and oriented x 3 at the time of the examination. The Mini-Mental status examination done today shows a total  score 28/30.  The patient is able to name 7 four-legged animals in 60 seconds.   Cranial nerves: Facial symmetry is present. Speech is normal, no aphasia or dysarthria is noted. Extraocular movements are full. Visual fields are full.  Masking of the face is seen.  Motor: The patient has good strength in all 4 extremities.  Sensory examination: Soft touch sensation is symmetric on the face, arms, and legs.  Coordination: The patient has good finger-nose-finger and heel-to-shin bilaterally.  Resting tremors are seen in both upper extremities, right greater than left.  Gait and station: The patient is able to arise from a seated position with arms crossed with some difficulty.   Once up, he can ambulate independently with decreased arm swing bilaterally, right greater than left.  He has a very unsteady tandem gait.  Romberg is unsteady.  Reflexes: Deep tendon reflexes are symmetric.   Assessment/Plan:  1.  Parkinson's disease  2.  Gait disturbance  3.  Memory disturbance  The patient will remain on Namenda, he cannot tolerate Aricept previously.  The patient will be increased on his Sinemet taking one half of a 25/100 mg tablet 3 times a day with the 25/250 mg tablet.  He will remain active.  Prescriptions were given for the Mirapex and for the 25/250 mg Sinemet.  The patient will follow-up here in 5 months.  Jill Alexanders MD 06/26/2019 9:06 AM  [facility] [address] [place], [place] [address]  Phone [phone] Fax [phone]

## 2019-06-26 NOTE — Telephone Encounter (Signed)
Disability form from [facility] system done by Dr. Anne Hahn. Needs to be signed. Pt paid 50 dollars. Pt will have to get this form done monthly so pt will not have to pay again. LEft for Dr. Janelle Floor to sign.

## 2019-07-01 NOTE — Telephone Encounter (Signed)
Disability form signed by Dr. Anne Hahn and given to Stanton Kidney in medical records for faxing and processing.

## 2019-07-04 ENCOUNTER — Telehealth: Payer: Self-pay

## 2019-07-04 NOTE — Telephone Encounter (Signed)
[employer] form completed done given to Leonard Mercer in medical records.Fee paid.

## 2019-07-23 ENCOUNTER — Telehealth: Payer: Self-pay

## 2019-07-23 NOTE — Telephone Encounter (Signed)
Disability form done and signed by Dr. Anne Hahn. No fee because form has to be done every month. Given to medical records.

## 2019-08-22 ENCOUNTER — Telehealth: Payer: Self-pay

## 2019-08-22 NOTE — Telephone Encounter (Signed)
Note Disability form done and signed by Dr. Anne Hahn. No fee because form has to be done every month. Given to medical records.GIven to Stanton Kidney to email to pt.

## 2019-09-17 ENCOUNTER — Telehealth: Payer: Self-pay | Admitting: *Deleted

## 2019-09-17 NOTE — Telephone Encounter (Signed)
Disability form completed, pending signature by Dr. Anne Hahn. No fee because form has to be done every month.

## 2019-09-18 NOTE — Telephone Encounter (Addendum)
Dr. Anne Hahn has signed form and copy of forms provided to Stanton Kidney in medical records.

## 2019-09-18 NOTE — Telephone Encounter (Signed)
Noted thank you

## 2019-12-11 ENCOUNTER — Ambulatory Visit: Payer: BC Managed Care – PPO | Admitting: Neurology

## 2020-01-21 ENCOUNTER — Telehealth: Payer: Self-pay | Admitting: Neurology

## 2020-01-21 NOTE — Telephone Encounter (Signed)
Pt called, [facility] notified, will have carbidopa-levodopa (SINEMET IR) 25-100 MG tablet until after January 2022. Do not know what to do. Would like a call from the nurse.

## 2020-01-21 NOTE — Telephone Encounter (Signed)
Patient stated that [facility] would not be able to fill his prescription til middle of January due to supply issue.  Stated that he was going to find another pharmacy to fill it and asked if we needed to be notified that he was changing pharmacys for that.  Explained to patient that the new pharmacy would handle transfer of the prescription but if he had to have it refilled at any time and changed pharmacys, to please update [place] so we would send the prescription to to the correct pharmacy.  Patient verbalized understanding and expressed appreciation.

## 2020-02-10 ENCOUNTER — Telehealth: Payer: Self-pay | Admitting: *Deleted

## 2020-02-10 NOTE — Telephone Encounter (Signed)
Pt called to confirm upcoming appt. Confirmed it is on 02/17/20 at 7:30am with Dr. Anne Hahn.

## 2020-02-17 ENCOUNTER — Ambulatory Visit (INDEPENDENT_AMBULATORY_CARE_PROVIDER_SITE_OTHER): Payer: Self-pay | Admitting: Neurology

## 2020-02-17 ENCOUNTER — Encounter: Payer: Self-pay | Admitting: Neurology

## 2020-02-17 VITALS — BP 118/75 | HR 82 | Ht 70.0 in | Wt 160.0 lb

## 2020-02-17 DIAGNOSIS — R413 Other amnesia: Secondary | ICD-10-CM

## 2020-02-17 DIAGNOSIS — G20 Parkinson's disease: Secondary | ICD-10-CM

## 2020-02-17 MED ORDER — CARBIDOPA-LEVODOPA 25-250 MG PO TABS: 1.0000 | ORAL_TABLET | Freq: Four times a day (QID) | ORAL | 1 refills | Status: DC

## 2020-02-17 MED ORDER — CARBIDOPA 25 MG PO TABS: 25.0000 mg | ORAL_TABLET | Freq: Four times a day (QID) | ORAL | 1 refills | Status: DC

## 2020-02-17 MED ORDER — PRAMIPEXOLE DIHYDROCHLORIDE 1 MG PO TABS: 1.0000 mg | ORAL_TABLET | Freq: Three times a day (TID) | ORAL | 1 refills | Status: DC

## 2020-02-17 NOTE — Progress Notes (Addendum)
Reason for visit: Parkinson's disease, memory disorder  Leonard Mercer is an 65 y.o. male  History of present illness:  Leonard Mercer is a 65 year old right-handed white male with a history of Parkinson's disease.  The patient is trying to stay active, he likes to fish and to make pottery.  He is noting increased problems with fine motor control of his hands.  He plays video games and has difficulty managing the joystick.  He is reporting some nausea associated with the medication.  He is on Sinemet taking the 25/100 mg tablet, 1/2 tablet 3 times a day with his Sinemet 25/250 mg tablet taking 1 tablet 3 times daily.  He takes Mirapex 1 mg 3 times daily.  The has had some issues with kicking and moving around during his sleep, he has not fallen out of bed.  He has had some more problems with being somewhat emotional, the patient indicates he feels as if his health is deteriorating.  He at times is having trouble with swallowing, he will put food in his mouth and cannot get it to the back of the throat to swallow, this may occur for about a week and then gets better.  He continues to have some memory issues.  He inquires about the utility of medical marijuana and deep brain stimulators for Parkinson's disease.  Past Medical History:  Diagnosis Date  . Chest pain   . Hypertension   . Kidney stone   . Memory disorder 11/07/2018  . Parkinson's disease (HCC) 09/12/2016    Past Surgical History:  Procedure Laterality Date  . HERNIA REPAIR      Family History  Problem Relation Age of Onset  . Hypertension Mother   . Diabetes Father   . Heart disease Maternal Grandfather     Social history:  reports that he has never smoked. He has never used smokeless tobacco. He reports current alcohol use. He reports that he does not use drugs.    Allergies  Allergen Reactions  . Ace Inhibitors Other (See Comments)    unknown  . Donepezil Hcl Other (See Comments)    Stomach upset Stomach upset  .  Pravastatin Rash    Medications:  Prior to Admission medications   Medication Sig Start Date End Date Taking? Authorizing Provider  albuterol (VENTOLIN HFA) 108 (90 Base) MCG/ACT inhaler Inhale 2 puffs into the lungs every 4 (four) hours. 04/25/19   [provider]  carbidopa-levodopa (SINEMET IR) 25-100 MG tablet Take 0.5 tablets by mouth 3 (three) times daily. 06/26/19   [name], MD  carbidopa-levodopa (SINEMET) 25-250 MG tablet Take 1 tablet by mouth 3 (three) times daily. 06/26/19   [name], MD  losartan (COZAAR) 50 MG tablet Take 50 mg by mouth daily. 09/01/16   [provider]  memantine (NAMENDA) 10 MG tablet Take 1 tablet (10 mg total) by mouth 2 (two) times daily. 01/24/19   [name], MD  metFORMIN (GLUCOPHAGE) 1000 MG tablet Take 1,000 mg by mouth 2 (two) times daily with a meal.    [provider]  pramipexole (MIRAPEX) 1 MG tablet Take 1 tablet (1 mg total) by mouth 3 (three) times daily. 06/26/19   [name], MD  pravastatin (PRAVACHOL) 20 MG tablet Take 20 mg by mouth daily.    [provider]    ROS:  Out of a complete 14 system review of symptoms, the patient complains only of the following symptoms, and all other  reviewed systems are negative.  Tremor Walking difficulty Memory problems  Blood pressure 118/75, pulse 82, height 5\' 10"  (1.778 m), weight 160 lb (72.6 kg).  Physical Exam  General: The patient is alert and cooperative at the time of the examination.  Skin: No significant peripheral edema is noted.   Neurologic Exam  Mental status: The patient is alert and oriented x 3 at the time of the examination. The patient has apparent normal recent and remote memory, with an apparently normal attention span and concentration ability.   Cranial nerves: Facial symmetry is present. Speech is normal, no aphasia or dysarthria is noted. Extraocular movements are full. Visual fields are full.  Masking  the face is seen.  Motor: The patient has good strength in all 4 extremities.  Sensory examination: Soft touch sensation is symmetric on the face, arms, and legs.  Coordination: The patient has good finger-nose-finger and heel-to-shin bilaterally.  Resting tremors are noted on both upper extremities.  Gait and station: The patient is able to rise from a seated position with arms crossed with some difficulty.  Once up, he is able to ambulate independently but has a stooped posture and decreased arm swing bilaterally.. Tandem gait is unsteady with a tendency to fall. Romberg is negative. No drift is seen.  Reflexes: Deep tendon reflexes are symmetric.   Assessment/Plan:  1.  Parkinson's disease  2.  Gait disorder  3.  Memory disorder  4.  REM sleep disorder  The patient has had some issues with nausea with medications, this could be related with the Mirapex or with the Sinemet, I will give him a prescription for a Lodosyn 25 mg to take with the Sinemet.  We will go up on the Sinemet slightly taking the 25/250 mg tablets, 1 tablet 4 times daily, he will discontinue the Sinemet 25/100 mg tablet.  He will continue on the Mirapex, a prescription was sent in for this.  If the swallowing issue worsens, we will get a speech therapy evaluation.  He is having a REM sleep disorder issue, if this worsens we will initiate treatment with clonazepam.  He will follow up here in 5 months.  I do not believe that medical marijuana is likely to help him and may exacerbate his memory issue.  The patient may not be a good candidate for deep brain stimulation secondary to his memory issues.  He is on disability currently.  MD 02/17/2020 7:53 AM  [facility] [address] [place], [place]  Phone [phone] Fax [phone]

## 2020-02-17 NOTE — Patient Instructions (Signed)
We will go up on the Sinemet to 25/250 mg four times a day, you may take Lodosyn 25 mg with the sinemet if the nausea remains an issue.  Sinemet (carbidopa) may result in confusion or hallucinations, drowsiness, nausea, or dizziness. If any significant side effects are noted, please contact our office. Sinemet may not be well absorbed when taken with high protein meals, if tolerated it is best to take 30-45 minutes before you eat.

## 2020-03-17 NOTE — Telephone Encounter (Signed)
Disability Eligibilty Review resubmitted.  Physician notes were attached from patient's last visit on 02/17/20.  All paperwork was given to Stanton Kidney in MR.

## 2020-04-21 ENCOUNTER — Telehealth: Payer: Self-pay | Admitting: *Deleted

## 2020-04-21 NOTE — Telephone Encounter (Signed)
Pt wife called for update on husband form. Please call [phone]

## 2020-04-21 NOTE — Telephone Encounter (Signed)
Returned wife's call back.  She dropped off disability paperwork last week for update.  Asking when it will be completed.  Let her know Dr. Pearlean Brownie just returned to the office this week.  I will call as soon as I get the paperwork back.

## 2020-04-28 NOTE — Telephone Encounter (Signed)
Called patient's wife.  She has brought another set today, I have received them and they are in Dr. Clarisa Kindred office.   She has been sent additional information from [employer] and needed updated.

## 2020-04-28 NOTE — Telephone Encounter (Signed)
Paperwork was given to Dr. Anne Hahn not Dr Pearlean Brownie.  This RN typed wrong provider.

## 2020-04-29 NOTE — Telephone Encounter (Signed)
Paperwork completed by Dr. Anne Hahn and given to Leonard Mercer in MR

## 2020-07-20 ENCOUNTER — Ambulatory Visit: Payer: BC Managed Care – PPO | Admitting: Neurology

## 2020-07-20 ENCOUNTER — Encounter: Payer: Self-pay | Admitting: Neurology

## 2020-07-20 VITALS — BP 144/92 | HR 63 | Ht 71.0 in | Wt 150.2 lb

## 2020-07-20 DIAGNOSIS — R413 Other amnesia: Secondary | ICD-10-CM | POA: Diagnosis not present

## 2020-07-20 DIAGNOSIS — G20 Parkinson's disease: Secondary | ICD-10-CM

## 2020-07-20 DIAGNOSIS — R1312 Dysphagia, oropharyngeal phase: Secondary | ICD-10-CM

## 2020-07-20 MED ORDER — ONDANSETRON HCL 4 MG PO TABS: 4.0000 mg | ORAL_TABLET | Freq: Three times a day (TID) | ORAL | 0 refills | Status: DC | PRN

## 2020-07-20 MED ORDER — CARBIDOPA-LEVODOPA ER 25-100 MG PO TBCR: 1.0000 | EXTENDED_RELEASE_TABLET | Freq: Three times a day (TID) | ORAL | 3 refills | Status: DC

## 2020-07-20 MED ORDER — MEMANTINE HCL 5 MG PO TABS: ORAL_TABLET | ORAL | 0 refills | Status: DC

## 2020-07-20 MED ORDER — ONDANSETRON HCL 4 MG PO TABS: 4.0000 mg | ORAL_TABLET | Freq: Three times a day (TID) | ORAL | 3 refills | Status: DC | PRN

## 2020-07-20 NOTE — Patient Instructions (Signed)
We will restart the Namenda for memory, work up to 10 mg twice a day.  Restart Sinemet ( carbidopa) with a long acting preparation, start 25/100 mg once a day after meals for one week, then take one twice a day for one week, then take one three times a day.  Stop the Requip.  Take zofran for nausea 4 mg 45 minutes prior to the Sinemet dosing.  Sinemet (carbidopa) may result in confusion or hallucinations, drowsiness, nausea, or dizziness. If any significant side effects are noted, please contact our office. Sinemet may not be well absorbed when taken with high protein meals, if tolerated it is best to take 30-45 minutes before you eat.

## 2020-07-20 NOTE — Progress Notes (Signed)
Reason for visit: Parkinson's disease  Leonard Mercer is an 65 y.o. male  History of present illness:  Leonard Mercer is a 65 year old right-handed white male with history of Parkinson's disease and a memory disorder.  The patient also has a history of diabetes with hemoglobin A1c of 6.7.  He has had significant issues over the last 6 months.  He claims that his stomach has been hurting him for about a year, and he has developed severe nausea when taking his Parkinson's medications which was not the case initially.  Over the last 6 months, he has essentially been off of all of his medications.  He lost his medical insurance, and he cannot afford his other medications as well.  He has had increasing symptoms of parkinsonism with difficulty with chewing and swallowing.  He has had increased fatigue and decreased ability to walk.  He is shuffling more, he has not had any falls.  He has had increased tremors involving the right arm but now also with the left.  He has difficulty using the right arm to do things.  He reports that he is drooling more, his memory has remained the same but still is poor, he is still operating a motor vehicle.  The patient reports that his stomach will hurt off and on throughout the day, when he eats something it helps the pain but the pain comes back later.  He is not having as much nausea when he is not taking the Parkinson's medications.  He is also having episodes where his eyes were closed and he cannot open them.  His speech is more mumbling in nature.  The patient has lost about 10 pounds of weight over the last 6 months.  He returns to the office today for an evaluation.  Past Medical History:  Diagnosis Date  . Chest pain   . Diabetes mellitus without complication (HCC)   . Hypertension   . Kidney stone   . Memory disorder 11/07/2018  . Parkinson's disease (HCC) 09/12/2016    Past Surgical History:  Procedure Laterality Date  . HERNIA REPAIR      Family History   Problem Relation Age of Onset  . Hypertension Mother   . Diabetes Father   . Heart disease Maternal Grandfather     Social history:  reports that he has never smoked. He has never used smokeless tobacco. He reports current alcohol use. He reports that he does not use drugs.    Allergies  Allergen Reactions  . Ace Inhibitors Other (See Comments)    unknown  . Donepezil Hcl Other (See Comments)    Stomach upset Stomach upset  . Sulfamethoxazole-Trimethoprim Nausea And Vomiting  . Pravastatin Rash    Medications:  Prior to Admission medications   Medication Sig Start Date End Date Taking? Authorizing Provider  albuterol (VENTOLIN HFA) 108 (90 Base) MCG/ACT inhaler Inhale 2 puffs into the lungs every 4 (four) hours. 04/25/19   [provider]  Carbidopa (LODOSYN) 25 MG tablet Take 1 tablet (25 mg total) by mouth 4 (four) times daily. 02/17/20   [name], MD  carbidopa-levodopa (SINEMET) 25-250 MG tablet Take 1 tablet by mouth 4 (four) times daily. 02/17/20   [name], MD  losartan (COZAAR) 50 MG tablet Take 50 mg by mouth daily. 09/01/16   [provider]  memantine (NAMENDA) 10 MG tablet Take 1 tablet (10 mg total) by mouth 2 (two) times daily. 01/24/19   Stephanie Acre  K, MD  metFORMIN (GLUCOPHAGE) 1000 MG tablet Take 1,000 mg by mouth 2 (two) times daily with a meal.    [provider]  pramipexole (MIRAPEX) 1 MG tablet Take 1 tablet (1 mg total) by mouth 3 (three) times daily. 02/17/20   [name], MD  pravastatin (PRAVACHOL) 20 MG tablet Take 20 mg by mouth daily.    [provider]    ROS:  Out of a complete 14 system review of symptoms, the patient complains only of the following symptoms, and all other reviewed systems are negative.  Weight loss Nausea, stomach pain Drooling, speech problem, difficulty with chewing and swallowing Memory problems Walking difficulty Tremor  Blood pressure (!) 144/92, pulse 63,  height 5\' 11"  (1.803 m), weight 150 lb 3.2 oz (68.1 kg).  Physical Exam  General: The patient is alert and cooperative at the time of the examination.  Skin: No significant peripheral edema is noted.   Neurologic Exam  Mental status: The patient is alert and oriented x 3 at the time of the examination. The Mini-Mental status examination done today shows a total score of 21/30.   Cranial nerves: Facial symmetry is present. Speech is slightly hypophonic, not aphasic. Extraocular movements are full. Visual fields are full.  Masking of the face is seen  Motor: The patient has good strength in all 4 extremities.  Sensory examination: Soft touch sensation is symmetric on the face, arms, and legs.  Coordination: The patient has good finger-nose-finger and heel-to-shin bilaterally, but his movements are slow.  Prominent resting tremors noted with the right greater than left upper extremities.  Gait and station: The patient has difficulty arising from a seated position, once up, he shuffles his feet, walks with his knees flexed slightly.  Tandem gait was not attempted.  Reflexes: Deep tendon reflexes are symmetric.   Assessment/Plan:  1.  Parkinson's disease  2.  Chronic abdominal discomfort, nausea  3.  Gait disorder  4.  Memory disorder  The patient is having onset of intolerance of his Parkinson's medication with severe nausea associated with the medication, but even off the medication he is now having daily abdominal pain.  For this reason, he will have a referral to a gastroenterologist for evaluation.  He will be set up for speech therapy evaluation for modified barium swallow given his history of dysphagia.  The patient will be placed on Zofran on a regular basis, and the Sinemet will be restarted with the 25/100 mg CR tablets working up by 1 tablet every week until he gets to 1 tablet 3 times daily.  He will follow-up by telephone to this office in the next 4 weeks.  The Requip  will be discontinued.  The memory issues will be followed over time.  He will follow-up in 3 to 4 months.  A handicap placard form was filled out.  MD 07/20/2020 7:28 AM  [facility] [address] [place], [place]  Phone [phone] Fax [phone]

## 2020-07-27 ENCOUNTER — Other Ambulatory Visit: Payer: Self-pay

## 2020-07-27 ENCOUNTER — Ambulatory Visit (HOSPITAL_COMMUNITY)
Admission: RE | Admit: 2020-07-27 | Discharge: 2020-07-27 | Disposition: A | Discharge status: Home / Self Care | Payer: BC Managed Care – PPO | Source: Ambulatory Visit | Attending: Neurology | Admitting: Neurology

## 2020-07-27 DIAGNOSIS — R1312 Dysphagia, oropharyngeal phase: Secondary | ICD-10-CM

## 2020-07-27 DIAGNOSIS — G20 Parkinson's disease: Secondary | ICD-10-CM | POA: Insufficient documentation

## 2020-07-27 DIAGNOSIS — R413 Other amnesia: Secondary | ICD-10-CM | POA: Insufficient documentation

## 2020-07-27 NOTE — Progress Notes (Signed)
Modified Barium Swallow Progress Note  Patient Details  Name: EPHREM CARRICK MRN: [id number] Date of Birth: 1956/02/11  Today's Date: 07/27/2020  Modified Barium Swallow completed.  Full report located under Chart Review in the Imaging Section.  Brief recommendations include the following:  Clinical Impression  Pt presents with oral phase dysphagia marked by munch-like vertical mastication and weak lingual manipulation of mechanical soft/regular textured solids. Pureed solids consumed without any observation of oropharyngeal difficulty, however when pt was posed with advanced textures (mechanical soft/regular) decomposition of solids was largely accomplished via mashing at roof of mouth with lingual body. Pharyngeal swallow WFL across all solid and liquid POs as indicated by strong pharyngeal stripping wave and laryngeal vestibule closure resulting in no incidence of aspiration and min pharyngeal residuals, cleared with independent swallows. Pill with thin liquids cleared oropharyngeal structures and cervical esophagus without difficulty. Recommend dysphagia 2 (ground)/thin liquid diet at this time. Some dysphagia 3 (chopped) food items may be manageable, however instructed pt and wife in regards to consuming foods primarily prepared ground/minced and moist. OP SLP f/u recommended for further intervention.   Swallow Evaluation Recommendations       SLP Diet Recommendations: Dysphagia 2 (Fine chop) solids;Thin liquid   Liquid Administration via: Straw;Cup   Medication Administration: Whole meds with liquid   Supervision: Patient able to self feed   Compensations: Slow rate;Small sips/bites;Follow solids with liquid   Postural Changes: Seated upright at 90 degrees   Oral Care Recommendations: Oral care BID       Avie Echevaria, MA, CCC-SLP Acute Rehabilitation Services Office Number: [phone]  Paulette Blanch 07/27/2020,1:10 PM

## 2020-07-28 ENCOUNTER — Telehealth: Payer: Self-pay | Admitting: Neurology

## 2020-07-28 DIAGNOSIS — G20 Parkinson's disease: Secondary | ICD-10-CM

## 2020-07-28 NOTE — Telephone Encounter (Signed)
Pt called stating that no one has reached out to him about the referral for his stomach issues. Please advise.

## 2020-07-28 NOTE — Telephone Encounter (Signed)
Patient was referred to [facility] GI/Endoscopy.  Shows ready for initial scheduling.  Gave patient office number to call   Patient denied further questions, verbalized understanding and expressed appreciation for the phone call.

## 2020-07-28 NOTE — Telephone Encounter (Signed)
Pt called needing to speak to the RN regarding his new medications and his PT that he is to be scheduled for. Please advise.

## 2020-07-29 NOTE — Telephone Encounter (Signed)
LVM for patient to return call. 

## 2020-07-29 NOTE — Telephone Encounter (Signed)
Pt returned phone call, would like a call back.  

## 2020-07-30 NOTE — Telephone Encounter (Signed)
I called the patient.  The patient will use the Zofran as long as it is helping and is [facility] needs it, hopefully a more treatable issue may be found on the GI work-up.  The patient will be set up for a speech therapy evaluation for improvement of speech amplitude.

## 2020-07-30 NOTE — Telephone Encounter (Signed)
Returned patient's call.  Wanted Dr. Anne Hahn to know his GI appointment is 9/8 The swallowing therapist is asking for him to get referral with speech.  Also, the zofran is effective and is helping well and he can now tolerate the carbidopa-levodopa.  Asking how long he will have to take the zofran?  Patient denied further questions, verbalized understanding and expressed appreciation for the phone call.

## 2020-07-30 NOTE — Addendum Note (Signed)
Addended by: [name] on: 07/30/2020 02:04 PM   Modules accepted: Orders

## 2020-08-03 ENCOUNTER — Ambulatory Visit (HOSPITAL_COMMUNITY): Payer: BC Managed Care – PPO

## 2020-08-03 ENCOUNTER — Encounter (HOSPITAL_COMMUNITY): Payer: BC Managed Care – PPO

## 2020-08-06 MED ORDER — CARBIDOPA-LEVODOPA 25-100 MG PO TABS: 0.5000 | ORAL_TABLET | Freq: Three times a day (TID) | ORAL | 1 refills | Status: DC

## 2020-08-06 NOTE — Telephone Encounter (Signed)
Pt would like to know if Dr Anne Hahn will increase the strength of his anti nausea medication, please call pt to discuss.

## 2020-08-06 NOTE — Telephone Encounter (Signed)
I called the patient.  The patient indicates that the Zofran is working quite well, the nausea is well controlled.  We will try to go up on the Sinemet that point, he will add a Sinemet 25/100 mg IR tablet, 1/2 tablet 3 times a day with the Sinemet CR 25/100 three times daily.  He is also complaining of a lot of constipation.  He has Dulcolax at home.  I have asked him to begin taking MiraLAX on a daily basis, take docusate 100 mg once or twice daily, and use the Dulcolax as a backup medication if needed if the constipation continues.

## 2020-08-06 NOTE — Addendum Note (Signed)
Addended by: [name] on: 08/06/2020 11:08 AM   Modules accepted: Orders

## 2020-08-07 ENCOUNTER — Encounter (HOSPITAL_COMMUNITY): Payer: Self-pay | Admitting: *Deleted

## 2020-08-07 ENCOUNTER — Emergency Department (HOSPITAL_COMMUNITY)
Admission: EM | Admit: 2020-08-07 | Discharge: 2020-08-08 | Disposition: A | Discharge status: Home / Self Care | Payer: BC Managed Care – PPO | Attending: Emergency Medicine | Admitting: Emergency Medicine

## 2020-08-07 ENCOUNTER — Other Ambulatory Visit: Payer: Self-pay

## 2020-08-07 DIAGNOSIS — E119 Type 2 diabetes mellitus without complications: Secondary | ICD-10-CM | POA: Insufficient documentation

## 2020-08-07 DIAGNOSIS — K5641 Fecal impaction: Secondary | ICD-10-CM | POA: Diagnosis not present

## 2020-08-07 DIAGNOSIS — K6289 Other specified diseases of anus and rectum: Secondary | ICD-10-CM | POA: Diagnosis present

## 2020-08-07 DIAGNOSIS — I10 Essential (primary) hypertension: Secondary | ICD-10-CM | POA: Insufficient documentation

## 2020-08-07 DIAGNOSIS — K5901 Slow transit constipation: Secondary | ICD-10-CM

## 2020-08-07 LAB — CBC WITH DIFFERENTIAL/PLATELET
Abs Immature Granulocytes: 0.05 10*3/uL (ref 0.00–0.07)
Basophils Absolute: 0 10*3/uL (ref 0.0–0.1)
Basophils Relative: 0 %
Eosinophils Absolute: 0.1 10*3/uL (ref 0.0–0.5)
Eosinophils Relative: 1 %
HCT: 45.1 % (ref 39.0–52.0)
Hemoglobin: 14.8 g/dL (ref 13.0–17.0)
Immature Granulocytes: 0 %
Lymphocytes Relative: 16 %
Lymphs Abs: 1.8 10*3/uL (ref 0.7–4.0)
MCH: 30.8 pg (ref 26.0–34.0)
MCHC: 32.8 g/dL (ref 30.0–36.0)
MCV: 94 fL (ref 80.0–100.0)
Monocytes Absolute: 0.7 10*3/uL (ref 0.1–1.0)
Monocytes Relative: 6 %
Neutro Abs: 8.5 10*3/uL — ABNORMAL HIGH (ref 1.7–7.7)
Neutrophils Relative %: 77 %
Platelets: 206 10*3/uL (ref 150–400)
RBC: 4.8 MIL/uL (ref 4.22–5.81)
RDW: 12 % (ref 11.5–15.5)
WBC: 11.2 10*3/uL — ABNORMAL HIGH (ref 4.0–10.5)
nRBC: 0 % (ref 0.0–0.2)

## 2020-08-07 LAB — BASIC METABOLIC PANEL
Anion gap: 6 (ref 5–15)
BUN: 31 mg/dL — ABNORMAL HIGH (ref 8–23)
CO2: 29 mmol/L (ref 22–32)
Calcium: 9.2 mg/dL (ref 8.9–10.3)
Chloride: 105 mmol/L (ref 98–111)
Creatinine, Ser: 0.91 mg/dL (ref 0.61–1.24)
GFR, Estimated: >60 mL/min (ref >60)
Glucose, Bld: 138 mg/dL — ABNORMAL HIGH (ref 70–99)
Potassium: 4.3 mmol/L (ref 3.5–5.1)
Sodium: 140 mmol/L (ref 135–145)

## 2020-08-07 MED ORDER — MINERAL OIL RE ENEM
1.0000 | ENEMA | Freq: Once | RECTAL | Status: AC
  Administered 2020-08-07: 1 via RECTAL
  Filled 2020-08-07: qty 1

## 2020-08-07 MED ORDER — LIDOCAINE HCL URETHRAL/MUCOSAL 2 % EX GEL
1.0000 "application " | Freq: Once | CUTANEOUS | Status: AC
  Administered 2020-08-07: 1 via TOPICAL
  Filled 2020-08-07: qty 10

## 2020-08-07 MED ORDER — FENTANYL CITRATE (PF) 100 MCG/2ML IJ SOLN
50.0000 ug | Freq: Once | INTRAMUSCULAR | Status: AC
Start: 2020-08-07 — End: 2020-08-07
  Administered 2020-08-07: 50 ug via INTRAVENOUS

## 2020-08-07 MED ORDER — FENTANYL CITRATE (PF) 100 MCG/2ML IJ SOLN
50.0000 ug | Freq: Once | INTRAMUSCULAR | Status: AC
  Administered 2020-08-07: 50 ug via INTRAVENOUS
  Filled 2020-08-07: qty 2

## 2020-08-07 NOTE — ED Notes (Signed)
Bed padded well prior to fleets enema. Enema given, pt then placed on bedpan for bowel movement.

## 2020-08-07 NOTE — ED Triage Notes (Signed)
Pt states he has not had a BM x one week; pt has tried suppositories and enema with no relief

## 2020-08-07 NOTE — ED Provider Notes (Signed)
[facility] EMERGENCY DEPARTMENT Provider Note   CSN: [id number] Arrival date & time: 08/07/20  1904     History Chief Complaint  Patient presents with   Abdominal Pain    Rectal pain, constipation x one week    Leonard Mercer is a 65 y.o. male with history of Parkinson's disease presenting to emergency department with rectal pain and constipation.  He reports he has not had a bowel movement in 1 week.  He reports rectal pain and fullness.  He did attempt to give himself an enema and a suppository with no relief.  He has difficulty and is unable to push out the stool.  This has never happened to him before in the past.  He denies nausea, vomiting.  HPI     Past Medical History:  Diagnosis Date   Chest pain    Diabetes mellitus without complication (HCC)    Hypertension    Kidney stone    Memory disorder 11/07/2018   Parkinson's disease (HCC) 09/12/2016    Patient Active Problem List   Diagnosis Date Noted   Memory disorder 11/07/2018   Fatigue 11/26/2017   Parkinson's disease (HCC) 09/12/2016   Chronic right shoulder pain 05/26/2015   Tremor 05/26/2015   ED (erectile dysfunction) 05/20/2015   Hyperlipidemia 05/20/2015   Diabetes (HCC) 05/02/2015   Hypertension 05/02/2015   Constipation 07/02/2012   Post-operative state 05/29/2012    Past Surgical History:  Procedure Laterality Date   HERNIA REPAIR         Family History  Problem Relation Age of Onset   Hypertension Mother    Diabetes Father    Heart disease Maternal Grandfather     Social History   Tobacco Use   Smoking status: Never   Smokeless tobacco: Never  Vaping Use   Vaping Use: Never used  Substance Use Topics   Alcohol use: Yes   Drug use: No    Home Medications Prior to Admission medications   Medication Sig Start Date End Date Taking? Authorizing Provider  carbidopa-levodopa (SINEMET IR) 25-100 MG tablet Take 0.5 tablets by mouth 3 (three) times daily. 08/06/20   [name], MD   Carbidopa-Levodopa ER (SINEMET CR) 25-100 MG tablet controlled release Take 1 tablet by mouth in the morning, at noon, and at bedtime. 07/20/20   [name], MD  memantine [facility]) 5 MG tablet Take 1 tablet daily for one week, then take 1 tablet twice daily for one week, then take 1 tablet in the morning and 2 in the evening for one week, then take 2 tablets twice daily 07/20/20   [name], MD  metFORMIN (GLUCOPHAGE) 1000 MG tablet Take 1,000 mg by mouth 2 (two) times daily with a meal. Patient not taking: Reported on 07/20/2020    [provider]  ondansetron (ZOFRAN) 4 MG tablet Take 1 tablet (4 mg total) by mouth every 8 (eight) hours as needed for nausea or vomiting. 07/20/20   [name], MD  pravastatin (PRAVACHOL) 20 MG tablet Take 20 mg by mouth daily. Patient not taking: Reported on 07/20/2020    [provider]    Allergies    Ace inhibitors, Donepezil hcl, Sulfamethoxazole-trimethoprim, and Pravastatin  Review of Systems   Review of Systems  Constitutional:  Negative for chills and fever.  Respiratory:  Negative for cough and shortness of breath.   Cardiovascular:  Negative for chest pain and palpitations.  Gastrointestinal:  Positive for abdominal pain and constipation. Negative for  vomiting.  Genitourinary:  Negative for dysuria and hematuria.  Musculoskeletal:  Negative for arthralgias and back pain.  Skin:  Negative for color change and rash.  Neurological:  Negative for seizures and syncope.  All other systems reviewed and are negative.  Physical Exam Updated Vital Signs BP 101/69   Pulse 63   Temp 98.1 F (36.7 C) (Oral)   Resp 17   Ht 5\' 11"  (1.803 m)   Wt 68 kg   SpO2 96%   BMI 20.92 kg/m   Physical Exam Constitutional:      General: He is not in acute distress. HENT:     Head: Normocephalic and atraumatic.  Eyes:     Conjunctiva/sclera: Conjunctivae normal.     Pupils: Pupils are equal, round, and reactive to light.   Cardiovascular:     Rate and Rhythm: Normal rate and regular rhythm.  Pulmonary:     Effort: Pulmonary effort is normal. No respiratory distress.  Abdominal:     General: There is no distension.     Tenderness: There is no abdominal tenderness.  Genitourinary:    Comments: Stool palpable in rectal vault Skin:    General: Skin is warm and dry.  Neurological:     General: No focal deficit present.     Mental Status: He is alert. Mental status is at baseline.  Psychiatric:        Mood and Affect: Mood normal.        Behavior: Behavior normal.    ED Results / Procedures / Treatments   Labs (all labs ordered are listed, but only abnormal results are displayed) Labs Reviewed  BASIC METABOLIC PANEL - Abnormal; Notable for the following components:      Result Value   Glucose, Bld 138 (*)    BUN 31 (*)    All other components within normal limits  CBC WITH DIFFERENTIAL/PLATELET - Abnormal; Notable for the following components:   WBC 11.2 (*)    Neutro Abs 8.5 (*)    All other components within normal limits    EKG None  Radiology No results found.  Procedures Procedures   Medications Ordered in ED Medications  lidocaine (XYLOCAINE) 2 % jelly 1 application (1 application Topical Given 08/07/20 2249)  fentaNYL (SUBLIMAZE) injection 50 mcg (50 mcg Intravenous Given 08/07/20 2248)  mineral oil enema 1 enema (1 enema Rectal Given 08/07/20 2346)  fentaNYL (SUBLIMAZE) injection 50 mcg (50 mcg Intravenous Given 08/07/20 2346)    ED Course  I have reviewed the triage vital signs and the nursing notes.  Pertinent labs & imaging results that were available during my care of the patient were reviewed by me and considered in my medical decision making (see chart for details).  This is a 65 year old with Parkinson's here with suspected fecal impaction.  Does not demonstrate signs of small bowel obstruction at this time.  He has a benign abdominal exam with no evidence of acute  perforation or peritonitis.  His blood test do show mild leukocytosis, BMP is normal.  We did attempt a rectal disimpaction or able to remove of his full amount of stool, however afterwards even with the help with an enema the patient has been unable to produce a bowel movement.  We will move now to obtain a CT scan of the abdomen to ensure that there is not an ileus, bowel obstruction, or another cause of his difficulty with bowel movement.  If he cannot produce a bowel movement, he may require hospitalization  and continued fluids and laxative support.  Clinical Course as of 08/08/20 0029  Fri Aug 07, 2020  2305 Disimpaction completed. [MT]  Sat Aug 08, 2020  0028 Signed out to Dr Bebe Shaggy EDP pending CT abdomen, reassessment for bowel movement. [MT]    Clinical Course User Index [MT] Leasha Goldberger, Kermit Balo, MD    Final Clinical Impression(s) / ED Diagnoses Final diagnoses:  Fecal impaction [facility])    Rx / DC Orders ED Discharge Orders     None        Xerxes Agrusa, Kermit Balo, MD 08/08/20 [phone]

## 2020-08-07 NOTE — ED Provider Notes (Signed)
Fecal disimpaction  Date/Time: 08/07/2020 11:05 PM Performed by: Karrie Meres, PA-C Authorized by: Karrie Meres, PA-C  Consent: Verbal consent obtained. Risks and benefits: risks, benefits and alternatives were discussed Consent given by: patient Patient understanding: patient states understanding of the procedure being performed Required items: required blood products, implants, devices, and special equipment available Patient identity confirmed: verbally with patient Time out: Immediately prior to procedure a "time out" was called to verify the correct patient, procedure, equipment, support staff and site/side marked as required. Preparation: Patient was prepped and draped in the usual sterile fashion. Local anesthesia used: yes  Anesthesia: Local anesthesia used: yes Local Anesthetic: lidocaine 2% without epinephrine  Sedation: Patient sedated: no  Patient tolerance: patient tolerated the procedure well with no immediate complications   Karrie Meres, PA-C 08/07/20 2305    Terald Sleeper, MD 08/08/20 [phone]

## 2020-08-08 ENCOUNTER — Emergency Department (HOSPITAL_COMMUNITY): Payer: BC Managed Care – PPO

## 2020-08-08 MED ORDER — IOHEXOL 300 MG/ML  SOLN
100.0000 mL | Freq: Once | INTRAMUSCULAR | Status: AC | PRN
  Administered 2020-08-08: 100 mL via INTRAVENOUS

## 2020-08-08 NOTE — ED Notes (Signed)
Pt did not move bowels, denies urge to defecate. MD notified.

## 2020-08-08 NOTE — ED Provider Notes (Signed)
I assumed care in signout to follow-up on CT imaging.  No signs of bowel obstruction. Patient is constipated.  He has been resting comfortably, no vomiting.  He just started  MiraLAX at the direction of his neurologist. Patient and family member feel comfortable with discharge home We discussed strict return precaution   Zadie Rhine, MD 08/08/20 [phone]

## 2020-09-02 ENCOUNTER — Telehealth: Payer: Self-pay | Admitting: Neurology

## 2020-09-02 MED ORDER — MEMANTINE HCL 5 MG PO TABS: ORAL_TABLET | ORAL | 0 refills | Status: DC

## 2020-09-02 NOTE — Telephone Encounter (Signed)
Pt requesting refill for metFORMIN (GLUCOPHAGE) 1000 MG tablet. Pharmacy [facility] [phone].

## 2020-09-02 NOTE — Telephone Encounter (Signed)
I called the pt. He sts the med he needed refill is the Namenda. I have refilled the medication to the [place] pharmacy as requested.

## 2020-09-03 ENCOUNTER — Other Ambulatory Visit: Payer: Self-pay

## 2020-09-03 MED ORDER — MEMANTINE HCL 5 MG PO TABS: ORAL_TABLET | ORAL | 0 refills | Status: DC

## 2020-10-22 ENCOUNTER — Encounter: Payer: BC Managed Care – PPO | Admitting: Gastroenterology

## 2020-10-26 ENCOUNTER — Other Ambulatory Visit: Payer: Self-pay

## 2020-10-26 ENCOUNTER — Ambulatory Visit: Payer: BC Managed Care – PPO | Admitting: Neurology

## 2020-10-26 ENCOUNTER — Encounter: Payer: Self-pay | Admitting: Neurology

## 2020-10-26 VITALS — BP 115/82 | HR 70 | Ht 71.0 in | Wt 165.4 lb

## 2020-10-26 DIAGNOSIS — R413 Other amnesia: Secondary | ICD-10-CM

## 2020-10-26 DIAGNOSIS — G20 Parkinson's disease: Secondary | ICD-10-CM

## 2020-10-26 MED ORDER — MEMANTINE HCL 10 MG PO TABS: 10.0000 mg | ORAL_TABLET | Freq: Two times a day (BID) | ORAL | 3 refills | Status: DC

## 2020-10-26 MED ORDER — CARBIDOPA-LEVODOPA ER 50-200 MG PO TBCR: 1.0000 | EXTENDED_RELEASE_TABLET | Freq: Three times a day (TID) | ORAL | 1 refills | Status: DC

## 2020-10-26 NOTE — Patient Instructions (Signed)
We will go up on the Sinemet (carbidopa) to the 50/200 mg CR tablets three times a day. Continue the Sinemet IR 25/100 mg 1/2 tablet three times a day with the CR tablet.  May use Melatonin 5 mg at night to help the sleep.

## 2020-10-26 NOTE — Progress Notes (Signed)
Reason for visit: Parkinson's disease, memory disorder, gait disorder  Leonard Mercer is an 64 y.o. male  History of present illness:  Leonard Mercer is a 65 year old right-handed white male with a history of Parkinson's disease.  He was seen in June 2022, he had gone off of his medications with significant abdominal pain and nausea.  The patient was seen in the emergency room on 07 August 2020 with fecal impaction.  He has been good about taking MiraLAX daily, keeping up with his bowel movements to prevent severe constipation again.  Once the constipation had been corrected, his nausea and abdominal pain has improved.  His wife canceled his GI appointment.  The patient remains on Sinemet but not at the dose he was prior to getting sick.  He is having some mobility issues, he has not had any falls.  He is having trouble with his sleep pattern, he will sleep to 3 hours at a time and then will get up and move about.  He does this day and night.  Some days he feels more fatigued than others.  He does not talk much in his sleep, he does not snore.  He comes back to this office for further evaluation.  His swallowing is much better since he has been back on his Sinemet.  Past Medical History:  Diagnosis Date   Chest pain    Diabetes mellitus without complication (HCC)    Hypertension    Kidney stone    Memory disorder 11/07/2018   Parkinson's disease (HCC) 09/12/2016    Past Surgical History:  Procedure Laterality Date   HERNIA REPAIR      Family History  Problem Relation Age of Onset   Hypertension Mother    Diabetes Father    Heart disease Maternal Grandfather     Social history:  reports that he has never smoked. He has never used smokeless tobacco. He reports current alcohol use. He reports that he does not use drugs.    Allergies  Allergen Reactions   Ace Inhibitors Other (See Comments)    unknown   Donepezil Hcl Other (See Comments)    Stomach upset Stomach upset    Sulfamethoxazole-Trimethoprim Nausea And Vomiting   Pravastatin Rash    Medications:  Prior to Admission medications   Medication Sig Start Date End Date Taking? Authorizing Provider  carbidopa-levodopa (SINEMET IR) 25-100 MG tablet Take 0.5 tablets by mouth 3 (three) times daily. 08/06/20  Yes [name], MD  Carbidopa-Levodopa ER (SINEMET CR) 25-100 MG tablet controlled release Take 1 tablet by mouth in the morning, at noon, and at bedtime. 07/20/20  Yes [name], MD  losartan (COZAAR) 50 MG tablet Take 50 mg by mouth daily. 08/21/20  Yes [provider]  memantine (NAMENDA) 5 MG tablet Take 2 tablets twice daily 09/03/20  Yes [name], MD  metFORMIN (GLUCOPHAGE) 1000 MG tablet Take 1,000 mg by mouth 2 (two) times daily with a meal.   Yes [provider]  polyethylene glycol (MIRALAX / GLYCOLAX) 17 g packet Take 17 g by mouth daily.   Yes [provider]  pravastatin (PRAVACHOL) 20 MG tablet Take 20 mg by mouth daily.   Yes [provider]    ROS:  Out of a complete 14 system review of symptoms, the patient complains only of the following symptoms, and all other reviewed systems are negative.  Tremor Constipation Stiffness Fatigue  Blood pressure 115/82, pulse 70, height 5\' 11"  (1.803  m), weight 165 lb 6.4 oz (75 kg).  Physical Exam  General: The patient is alert and cooperative at the time of the examination.  Skin: No significant peripheral edema is noted.   Neurologic Exam  Mental status: The patient is alert and oriented x 3 at the time of the examination. The patient has apparent normal recent and remote memory, with an apparently normal attention span and concentration ability.   Cranial nerves: Facial symmetry is present. Speech is normal, no aphasia or dysarthria is noted. Extraocular movements are full. Visual fields are full.  Masking the face is noted.  Motor: The patient has good strength in all 4  extremities.  Sensory examination: Soft touch sensation is symmetric on the face, arms, and legs.  Coordination: The patient has good finger-nose-finger and heel-to-shin bilaterally.  Gait and station: The patient is unable to rise from a seated position with arms crossed, he can push off with his arms and stand up.  Once up, he can walk independently with a shuffling gait, arms are in flexion with tremors bilaterally, decreased arm swing.  Tandem gait is unsteady.  Romberg is negative but is unsteady.  Reflexes: Deep tendon reflexes are symmetric.   Assessment/Plan:  1.  Parkinson's disease  2.  Memory disturbance  3.  Gait disturbance  We will go up on the Sinemet taking the 50/200 mg CR tablets 3 times daily.  He will continue the Sinemet IR 25/100 mg tablets, 1/2 tablet with each dose of the CR tablet.  The patient will begin melatonin 5 mg at night.  He will follow-up here in 4 months, in the future he can be seen through Dr. Frances Furbish.  Marlan Palau MD 10/26/2020 7:20 AM  [facility] [address] [place], [place] [address]  Phone [phone] Fax [phone]

## 2020-11-05 ENCOUNTER — Encounter: Payer: BC Managed Care – PPO | Admitting: Gastroenterology

## 2021-02-09 ENCOUNTER — Other Ambulatory Visit: Payer: Self-pay | Admitting: *Deleted

## 2021-02-09 MED ORDER — CARBIDOPA-LEVODOPA 25-100 MG PO TABS: 0.5000 | ORAL_TABLET | Freq: Three times a day (TID) | ORAL | 0 refills | Status: DC

## 2021-03-08 ENCOUNTER — Ambulatory Visit: Payer: BC Managed Care – PPO | Admitting: Neurology

## 2021-03-08 ENCOUNTER — Encounter: Payer: Self-pay | Admitting: Neurology

## 2021-03-08 ENCOUNTER — Other Ambulatory Visit: Payer: Self-pay

## 2021-03-08 VITALS — BP 132/81 | HR 68 | Ht 71.0 in | Wt 165.6 lb

## 2021-03-08 DIAGNOSIS — G479 Sleep disorder, unspecified: Secondary | ICD-10-CM

## 2021-03-08 DIAGNOSIS — G20 Parkinson's disease: Secondary | ICD-10-CM

## 2021-03-08 DIAGNOSIS — K5909 Other constipation: Secondary | ICD-10-CM | POA: Diagnosis not present

## 2021-03-08 DIAGNOSIS — R413 Other amnesia: Secondary | ICD-10-CM | POA: Diagnosis not present

## 2021-03-08 DIAGNOSIS — R4589 Other symptoms and signs involving emotional state: Secondary | ICD-10-CM

## 2021-03-08 MED ORDER — CARBIDOPA-LEVODOPA 25-100 MG PO TABS: 1.0000 | ORAL_TABLET | Freq: Three times a day (TID) | ORAL | 0 refills | Status: DC

## 2021-03-08 NOTE — Patient Instructions (Signed)
° °  You can try Melatonin again at night for sleep: take 10 mg, one to 2 hours before your bedtime. You can go up to 15 mg if needed. It is over the counter and comes in pill form, chewable form and spray, if you prefer.   Please take melatonin around 11 PM and try to keep a bedtime of around between midnight and 2 AM for now.  I think it is important for you to keep the schedule of your rise time and bedtime.  Please take your first dose of Sinemet CR and IR around 10 AM, second dose at 3 PM and last dose at 9 PM each day for now, we may increase this at the next appointment. Please monitor your depression symptoms.  If you need to talk to your primary care about this, please make an appointment, we can also address at the next appointment and consider a low-dose antidepressant medication. Please try to increase your water intake.  We recommend 6 to 8 cups of water per day.  Limit your caffeine to 1 or 2 servings per day.  Please try to exercise on a regular basis, you could try to walk or use a stationary recumbent bike.  We will increase your immediate release Sinemet to 1 pill 3 times a day.  We will keep your Sinemet CR the same.  Please be mindful that the medication does not get absorbed well with food.

## 2021-03-08 NOTE — Progress Notes (Addendum)
Subjective:    Patient ID: Leonard Mercer is a 66 y.o. male.  HPI    Interim history:   Leonard Mercer is a 66 year old right-handed gentleman with an underlying medical history of diabetes, hypertension, kidney stones, memory loss, and history of chest pain, who presents for follow-up consultation of his parkinsonism, complicated by constipation, memory loss, and sleep disturbance.  The patient is accompanied by his wife, Leonard Mercer, today.  He was previously followed by Dr. Margette Fast and was last seen by him on 10/26/2020, at which time he was advised to start melatonin at night.  Sinemet CR was increased to 50-200 mg strength 1 pill 3 times daily and he was on Sinemet immediate release 25-100 mg strength half a pill 3 times daily. I reviewed the note and copied the note below for reference.  He had a brain MRI without contrast on 10/01/2016.  I reviewed the results: IMPRESSION:  This noncontrasted MRI of the brain shows the following: 1.    There are a few T2/FLAIR hyperintense foci in the hemispheres consistent with minimal age-appropriate chronic microvessel ischemic change. None of these appear to be acute. 2.    Maxillary and ethmoid chronic sinusitis 3.   There are no acute findings   Today, 03/08/2021: He reports having good days and bad days.  He did not take his morning medications today.  He has not had any recent falls, constipation is under reasonable control with daily MiraLAX.  He has a bowel movement every other day approximately.  He does admit to not doing much physically, he does not exercise on a regular basis, does not feel like doing anything, has been depressed he does not with lack of motivation.  Has not discussed this with his primary care PA yet.  He does not sleep well.  Melatonin has not been very helpful, he does not keep a schedule, tends to stay up late and playing video games.  May be in bed somewhere between midnight and 3 AM and rise time varies between 10 AM and 1 PM.   His wife works.  He does not take his medicine on a consistent schedule.  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  He does not hydrate well with water, drinks diet soda and bottles or cans of about 5 or 6/day on average, very little water.  He takes Sinemet CR and half a pill of the IR somewhere between 9 and 10 AM or as late as 1 PM, second dose around 3 or 4 PM, last dose somewhere between midnight and 3 AM.  It is difficult for him to break the IR pill in half.     In reviewing the chart, he started having symptoms in 2018.  He had right-sided tremors.  He was seen by Dr. Jannifer Franklin on 09/12/2017 and started on Mirapex at the time.  The patient's allergies, current medications, family history, past medical history, past social history, past surgical history and problem list were reviewed and updated as appropriate.   Previously:   10/26/20 (Dr. Jannifer Franklin):  <<Leonard Mercer is a 66 year old right-handed white male with a history of Parkinson's disease.  He was seen in June 2022, he had gone off of his medications with significant abdominal pain and nausea.  The patient was seen in the emergency room on 07 August 2020 with fecal impaction.  He has been good about taking MiraLAX daily, keeping up with his  bowel movements to prevent severe constipation again.  Once the constipation had been corrected, his nausea and abdominal pain has improved.  His wife canceled his GI appointment.  The patient remains on Sinemet but not at the dose he was prior to getting sick.  He is having some mobility issues, he has not had any falls.  He is having trouble with his sleep pattern, he will sleep to 3 hours at a time and then will get up and move about.  He does this day and night.  Some days he feels more fatigued than others.  He does not talk much in his sleep, he does not snore.  He comes back to this office for further evaluation.  His swallowing is much better since he has  been back on his Sinemet.>>  His Past Medical History Is Significant For: Past Medical History:  Diagnosis Date   Chest pain    Diabetes mellitus without complication ([place])    Hypertension    Kidney stone    Memory disorder 11/07/2018   Parkinson's disease ([place]) 09/12/2016    His Past Surgical History Is Significant For: Past Surgical History:  Procedure Laterality Date   HERNIA REPAIR      His Family History Is Significant For: Family History  Problem Relation Age of Onset   Hypertension Mother    Diabetes Father    Heart disease Maternal Grandfather     His Social History Is Significant For: Social History   Socioeconomic History   Marital status: Married    Spouse name: Leonard Mercer   Number of children: 1   Years of education: 18   Highest education level: Not on file  Occupational History   Occupation: [employer]  Tobacco Use   Smoking status: Never   Smokeless tobacco: Never  Vaping Use   Vaping Use: Never used  Substance and Sexual Activity   Alcohol use: Yes   Drug use: No   Sexual activity: Not on file  Other Topics Concern   Not on file  Social History Narrative   Lives with wife   Caffeine use: Diet sodas   Right handed   Social Determinants of Health   Financial Resource Strain: Not on file  Food Insecurity: Not on file  Transportation Needs: Not on file  Physical Activity: Not on file  Stress: Not on file  Social Connections: Not on file    His Allergies Are:  Allergies  Allergen Reactions   Ace Inhibitors Other (See Comments)    unknown   Donepezil Hcl Other (See Comments)    Stomach upset Stomach upset   Sulfamethoxazole-Trimethoprim Nausea And Vomiting   Pravastatin Rash  :   His Current Medications Are:  Outpatient Encounter Medications as of 03/08/2021  Medication Sig   carbidopa-levodopa (SINEMET CR) 50-200 MG tablet Take 1 tablet by mouth 3 (three) times daily.   carbidopa-levodopa (SINEMET IR) 25-100 MG  tablet Take 0.5 tablets by mouth 3 (three) times daily.   losartan (COZAAR) 50 MG tablet Take 50 mg by mouth daily.   memantine (NAMENDA) 10 MG tablet Take 1 tablet (10 mg total) by mouth 2 (two) times daily.   metFORMIN (GLUCOPHAGE) 1000 MG tablet Take 1,000 mg by mouth 2 (two) times daily with a meal.   polyethylene glycol (MIRALAX / GLYCOLAX) 17 g packet Take 17 g by mouth daily.   pravastatin (PRAVACHOL) 20 MG tablet Take 20 mg by mouth daily.   No facility-administered encounter medications on file  as of 03/08/2021.  :  Review of Systems:  Out of a complete 14 point review of systems, all are reviewed and negative with the exception of these symptoms as listed below:  Review of Systems  Neurological:        Last seen with Dr. Jannifer Franklin [phone]. Stable.  Having trouble 1/2 pills. 50% good and bad days.    Objective:  Neurological Exam  Physical Exam Physical Examination:   Vitals:   03/08/21 1040  BP: 132/81  Pulse: 68    General Examination: The patient is a very pleasant 66 y.o. male in no acute distress. He appears well-developed and well-nourished and well groomed.   HEENT: Normocephalic, atraumatic, pupils are equal, round and reactive to light, extraocular tracking is mildly impaired, mild bilateral cataracts noted.  Moderate facial masking noted, mild nuchal rigidity.  Speech with mild hypophonia, no obvious dysarthria.  No lip, neck or jaw tremor.  Mild to moderate mouth dryness noted, tongue protrudes centrally and palate elevates symmetrically.  Malar rash noted across the nose and cheek areas, ?  Rosacea.  Chest: Clear to auscultation without wheezing, rhonchi or crackles noted.  Heart: S1+S2+0, regular and normal without murmurs, rubs or gallops noted.   Abdomen: Soft, non-tender and non-distended with normal bowel sounds appreciated on auscultation.  Extremities: There is no pitting edema in the distal lower extremities bilaterally.   Skin: Warm and dry without  trophic changes noted.   Musculoskeletal: exam reveals no obvious joint deformities.   Neurologically:  Mental status: The patient is awake, alert and oriented in all 4 spheres. His immediate and remote memory, attention, language skills and fund of knowledge are fair. Bradyphrenia noted. Mood is mildly depressed, affect is mildly blunted.  He becomes tearful briefly during the exam.  Cranial nerves II - XII are as described above under HEENT exam.  Motor exam: Normal bulk, right more than left with cogwheeling noted.  He has a mild to moderate resting tremor in the right upper extremity, a milder resting tremor in the left upper extremity, no resting tremor in the legs noted.  He has a mild postural tremor, minimal action tremor, no obvious intention tremor.  Fine motor skills and coordination: Difficulty in the right upper and right lower extremities with finger taps and foot taps and hand movements, better on the left.   Cerebellar testing: No dysmetria or intention tremor. There is no truncal or gait ataxia.  Romberg is not tested due to safety concerns.  Sensory exam: intact to light touch in the upper and lower extremities.  Gait, station and balance: He stands with mild difficulty, posture is moderately stooped for age.  He walks with decreased stride length and pace and decreased arm swing, more pronounced tremor in both upper extremities, more so on the right.  Balance is fairly well-preserved.  Assessment and plan:   In summary, Leonard Mercer is a very pleasant 66 y.o.-year old male with an underlying medical history of diabetes, hypertension, kidney stones, memory loss, and history of chest pain, who presents for follow-up consultation of his right-sided predominant Parkinson's disease with symptoms dating back to 2018.  He has had progression over time, complications included constipation and memory loss as well as sleep disturbance. I had a long chat with the patient and his wife about  His symptoms, my findings and the diagnosis of parkinsonism/Parksinson's disease, its prognosis and treatment options. We talked about medical treatments and non-pharmacological approaches. We talked about maintaining a healthy lifestyle  in general. I encouraged the patient to eat healthy, exercise daily and keep well hydrated.  For now, he is advised to increase his water intake and try to incorporate day-to-day exercise to his schedule.  He is encouraged to keep a more set schedule for his sleep.  He is advised to increase his Sinemet IR to 1 pill 3 times daily and change the timings of his medication to 10 AM, 3 PM and 9 PM daily for the Sinemet CR and IR for now.  We may increase his regimen to 4 times daily in the near future.  We talked about mood and depression as well today.  He is encouraged to talk to his primary care about management of mood disorder and we can also consider addition of a small dose of antidepressant at the next visit.  He is not currently suicidal.  We talked about the importance of constipation management.  Again, he is hydration and exercising regularly may improve his constipation.  He had to go to the hospital in the summer 2022 for severe constipation.  He is advised to continue with memantine.  He is advised to add melatonin at night, 10 mg to 15 mg around 11 PM for now. We will follow-up in this clinic in about 3 months, sooner if needed.  He is advised to get checked out by dermatology, he has a rash on the face.  He also reports an itchy rash on his back before.  Has not seen dermatology for this.  I answered all the questions today and the patient and his wife were in agreement.I spent 40 minutes in total face-to-face time and in reviewing records during pre-charting, more than 50% of which was spent in counseling and coordination of care, reviewing test results, reviewing medications and treatment regimen and/or in discussing or reviewing the diagnosis of PD, the prognosis and  treatment options. Pertinent laboratory and imaging test results that were available during this visit with the patient were reviewed by me and considered in my medical decision making (see chart for details).

## 2021-05-31 ENCOUNTER — Ambulatory Visit: Payer: BC Managed Care – PPO | Admitting: Neurology

## 2021-05-31 VITALS — BP 125/80 | HR 70 | Ht 71.0 in | Wt 161.4 lb

## 2021-05-31 DIAGNOSIS — R413 Other amnesia: Secondary | ICD-10-CM | POA: Diagnosis not present

## 2021-05-31 DIAGNOSIS — K5909 Other constipation: Secondary | ICD-10-CM

## 2021-05-31 DIAGNOSIS — G479 Sleep disorder, unspecified: Secondary | ICD-10-CM | POA: Diagnosis not present

## 2021-05-31 DIAGNOSIS — R4589 Other symptoms and signs involving emotional state: Secondary | ICD-10-CM

## 2021-05-31 DIAGNOSIS — G20 Parkinson's disease: Secondary | ICD-10-CM

## 2021-05-31 MED ORDER — CARBIDOPA-LEVODOPA ER 50-200 MG PO TBCR: 1.0000 | EXTENDED_RELEASE_TABLET | Freq: Three times a day (TID) | ORAL | 1 refills | Status: DC

## 2021-05-31 MED ORDER — CARBIDOPA-LEVODOPA 25-100 MG PO TABS: 1.0000 | ORAL_TABLET | Freq: Three times a day (TID) | ORAL | 0 refills | Status: DC

## 2021-05-31 MED ORDER — ESCITALOPRAM OXALATE 5 MG PO TABS: 5.0000 mg | ORAL_TABLET | Freq: Every day | ORAL | 4 refills | Status: DC

## 2021-05-31 NOTE — Patient Instructions (Signed)
It was nice to see you both again today.  As discussed, we will keep your Sinemet CR and Sinemet IR the same for now, we may increase the Sinemet immediate release to 1 pill 4 times a day at the next visit.  Please schedul to see the nurse practitioner in 3 to 4 months.  We will start you on low-dose Lexapro today at 5 mg strength.  Please be advised that antidepressant medications can take up to 6 weeks to start taking effect.  We may increase it at the next visit.  Side effects may include headaches, sleepiness, dizziness, and transient worsening of depression.  Please call us if you have any questions or talk to your pharmacist about additional side effect concerns.  ? ?

## 2021-05-31 NOTE — Progress Notes (Signed)
Subjective:  ?  ?Patient ID: Leonard Mercer is a 66 y.o. male. ? ?HPI ? ? ? ?Interim history:  ?Leonard Mercer is a 66 year old right-handed gentleman with an underlying medical history of diabetes, hypertension, kidney stones, memory loss, and history of chest pain, who presents for follow-up consultation of his parkinsonism, complicated by constipation, memory loss, and sleep disturbance.  The patient is accompanied by his wife, Leonard Mercer, again today. I first met him on 03/08/2021, at which time I asked him to start melatonin at night for sleep.  He was advised to increase his Sinemet to 1 pill 3 times daily and keep his Sinemet CR at 1 pill 3 times daily.  He was advised to continue with memantine 10 mg twice daily and be proactive about constipation issues.  He was advised to drink more water. ? ?Today, 05/31/2021: He reports not feeling motivated to do a whole lot.  He would like to be able to do more physical activity and outdoor activities.  He is more sedentary.  He is taking Sinemet IR and CR 3 times a day, generally first dose around 9:30 AM or 10 AM, second dose around 2 PM and third dose around 9 or 10 PM.  He likes to play games on the computer.  He has tried melatonin 20 mg at night but had daytime grogginess the next day.  He has melatonin Gummies and each is 10 mg strength.  He has not tried it consistently at only 10 mg.  He has tried hemp Gummies, half a gummy at night which helps him calm down and sleep a little better he feels.  He has ongoing issues with constipation, takes MiraLAX as needed, every other day or every third day.  He has not fallen.  He does endorse depressive symptoms, has never been on any antidepressant, has not discussed this with his primary care provider either.  ? ?Previously:  ? ?03/08/21: He was previously followed by Dr. Margette Fast and was last seen by him on 10/26/2020, at which time he was advised to start melatonin at night.  Sinemet CR was increased to 50-200 mg strength 1  pill 3 times daily and he was on Sinemet immediate release 25-100 mg strength half a pill 3 times daily. I reviewed the note and copied the note below for reference. ?  ?He had a brain MRI without contrast on 10/01/2016.  I reviewed the results: IMPRESSION:  This noncontrasted MRI of the brain shows the following: ?1.    There are a few T2/FLAIR hyperintense foci in the hemispheres consistent with minimal age-appropriate chronic microvessel ischemic change. None of these appear to be acute. ?2.    Maxillary and ethmoid chronic sinusitis ?3.   There are no acute findings  ?  ?He reports having good days and bad days.  He did not take his morning medications today.  He has not had any recent falls, constipation is under reasonable control with daily MiraLAX.  He has a bowel movement every other day approximately.  He does admit to not doing much physically, he does not exercise on a regular basis, does not feel like doing anything, has been depressed he does not with lack of motivation.  Has not discussed this with his primary care PA yet.  He does not sleep well.  Melatonin has not been very helpful, he does not keep a schedule, tends to stay up late and playing video games.  May be in bed somewhere between midnight and  3 AM and rise time varies between 10 AM and 1 PM.  His wife works.  He does not take his medicine on a consistent schedule.  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  He does not hydrate well with water, drinks diet soda and bottles or cans of about 5 or 6/day on average, very little water.  He takes Sinemet CR and half a pill of the IR somewhere between 9 and 10 AM or as late as 1 PM, second dose around 3 or 4 PM, last dose somewhere between midnight and 3 AM.  It is difficult for him to break the IR pill in half.   ? ?In reviewing the chart, he started having symptoms in 2018.  He had right-sided tremors.  He was seen by Dr. Jannifer Franklin on 09/12/2017  and started on Mirapex at the time. ?  ?  ?10/26/20 (Dr. Jannifer Franklin):  ?<<Leonard Mercer is a 66 year old right-handed white male with a history of Parkinson's disease.  He was seen in June 2022, he had gone off of his medications with significant abdominal pain and nausea.  The patient was seen in the emergency room on 07 August 2020 with fecal impaction.  He has been good about taking MiraLAX daily, keeping up with his bowel movements to prevent severe constipation again.  Once the constipation had been corrected, his nausea and abdominal pain has improved.  His wife canceled his GI appointment.  The patient remains on Sinemet but not at the dose he was prior to getting sick.  He is having some mobility issues, he has not had any falls.  He is having trouble with his sleep pattern, he will sleep to 3 hours at a time and then will get up and move about.  He does this day and night.  Some days he feels more fatigued than others.  He does not talk much in his sleep, he does not snore.  He comes back to this office for further evaluation.  His swallowing is much better since he has been back on his Sinemet.>> ? ? ?His Past Medical History Is Significant For: ?Past Medical History:  ?Diagnosis Date  ? Chest pain   ? Diabetes mellitus without complication ([place])   ? Hypertension   ? Kidney stone   ? Memory disorder 11/07/2018  ? Parkinson's disease ([employer]) 09/12/2016  ? ? ?His Past Surgical History Is Significant For: ?Past Surgical History:  ?Procedure Laterality Date  ? HERNIA REPAIR    ? ? ?His Family History Is Significant For: ?Family History  ?Problem Relation Age of Onset  ? Hypertension Mother   ? Diabetes Father   ? Heart disease Maternal Grandfather   ? ? ?His Social History Is Significant For: ?Social History  ? ?Socioeconomic History  ? Marital status: Married  ?  Spouse name: Leonard Mercer  ? Number of children: 1  ? Years of education: 33  ? Highest education level: Not on file  ?Occupational History  ? Occupation: [employer] system  ?Tobacco Use  ? Smoking status: Never  ? Smokeless tobacco: Never  ?Vaping Use  ? Vaping Use: Never used  ?Substance and Sexual Activity  ? Alcohol use: Yes  ? Drug use: No  ? Sexual activity: Not on file  ?Other Topics Concern  ? Not on file  ?Social History Narrative  ? Lives with wife  ? Caffeine use: Diet sodas  ? Right  handed  ? ?Social Determinants of Health  ? ?Financial Resource Strain: Not on file  ?Food Insecurity: Not on file  ?Transportation Needs: Not on file  ?Physical Activity: Not on file  ?Stress: Not on file  ?Social Connections: Not on file  ? ? ?His Allergies Are:  ?Allergies  ?Allergen Reactions  ? Ace Inhibitors Other (See Comments)  ?  unknown  ? Donepezil Hcl Other (See Comments)  ?  Stomach upset ?Stomach upset  ? Sulfamethoxazole-Trimethoprim Nausea And Vomiting  ? Pravastatin Rash  ?:  ? ?His Current Medications Are:  ?Outpatient Encounter Medications as of 05/31/2021  ?Medication Sig  ? carbidopa-levodopa (SINEMET CR) 50-200 MG tablet Take 1 tablet by mouth 3 (three) times daily.  ? carbidopa-levodopa (SINEMET IR) 25-100 MG tablet Take 1 tablet by mouth 3 (three) times daily.  ? losartan (COZAAR) 50 MG tablet Take 50 mg by mouth daily.  ? memantine (NAMENDA) 10 MG tablet Take 1 tablet (10 mg total) by mouth 2 (two) times daily.  ? metFORMIN (GLUCOPHAGE) 1000 MG tablet Take 1,000 mg by mouth 2 (two) times daily with a meal.  ? polyethylene glycol (MIRALAX / GLYCOLAX) 17 g packet Take 17 g by mouth daily.  ? pravastatin (PRAVACHOL) 20 MG tablet Take 20 mg by mouth daily.  ? ?No facility-administered encounter medications on file as of 05/31/2021.  ?: ? ?Review of Systems:  ?Out of a complete 14 point review of systems, all are reviewed and negative with the exception of these symptoms as listed below: ? ?Review of Systems  ?Neurological:   ?     Doing ok, no changes.  No falls.  More tired days then usual. Wife questioned about memory testing that was done previously.  MMSE  done.   ? ?Objective:  ?Neurological Exam ? ?Physical Exam ?Physical Examination:  ? ?Vitals:  ? 05/31/21 0936  ?BP: 125/80  ?Pulse: 70  ? ? ?General Examination: The patient is a very pleasant 66 y.o. ma

## 2021-09-16 NOTE — Progress Notes (Signed)
[facility] [address] [place]. [place] [address] [phone]       OFFICE FOLLOW UP NOTE  [name] Date of Birth:  [birth year] Medical Record Number:  [id number]    Primary neurologist: Dr. Rexene Mercer Reason for visit: Parkinson's disease    SUBJECTIVE:   CHIEF COMPLAINT:  Chief Complaint  Patient presents with   Follow-up    Pt is well. With wife. No major questions or concerns.    HPI:   Leonard Mercer is a 66 year old right-handed gentleman with an underlying medical history of diabetes, hypertension, kidney stones, memory loss, and history of chest pain, who presents for follow-up consultation of his parkinsonism, complicated by constipation, memory loss, and sleep disturbance.  The patient is accompanied by his wife, Leonard Mercer, again today.  First seen by Dr. Rexene Mercer 03/08/2021, increase Sinemet to 1 pill 3 times daily and continue Sinemet CR 1 pill 3 times daily, melatonin, and memantine.  At prior visit 05/31/2021, Dr. Rexene Mercer recommend initiating Lexapro 5 mg daily for depression and continue current dose of Sinemet and Namenda.    Update 09/16/2021 Leonard Mercer:   Continues on Sinemet IR and CR 3 times daily -Currently, taking around 12-1pm, 3-4pm and 6-7pm. He has been sleeping longer usually waking up around 11 AM. Previously was setting an alarm clock to take his medication earlier but has not been recently doing this. -believes tremor has worsened some since prior visit  did have 1 fall since prior visit - caught his right toes going up stairs thankfully without injury. No other fall. Gait/balance can worsen later in the day.  Admits to being sedentary with limited daytime activity or exercise and watches TV majority of the day.  Denies any issues with swallowing. Does have occasional issues with constipation, will use MiraLAX as needed.   Cognition relatively stable since prior visit.  Remains on memantine 10 mg twice daily.  Denies side effects. Does have some  difficulty finding the correct word.  No behavioral related concerns.  Use of Lexapro 5 mg daily with some improvement of mood but has not seen any significant difference.  Denies any side effects.  Continues to have difficulty with insomnia, he has not been using melatonin.  Typically falls asleep around 2-3AM.  Once he is able to fall asleep, he is able to stay asleep.   No further concerns at this time      History provided for reference purposes only 05/31/2021 Dr. Rexene Mercer: He reports not feeling motivated to do a whole lot.  He would like to be able to do more physical activity and outdoor activities.  He is more sedentary.  He is taking Sinemet IR and CR 3 times a day, generally first dose around 9:30 AM or 10 AM, second dose around 2 PM and third dose around 9 or 10 PM.  He likes to play games on the computer.  He has tried melatonin 20 mg at night but had daytime grogginess the next day.  He has melatonin Gummies and each is 10 mg strength.  He has not tried it consistently at only 10 mg.  He has tried hemp Gummies, half a gummy at night which helps him calm down and sleep a little better he feels.  He has ongoing issues with constipation, takes MiraLAX as needed, every other day or every third day.  He has not fallen.  He does endorse depressive symptoms, has never been on any antidepressant, has not discussed this with his primary  care provider either.     03/08/21 Dr. Rexene Mercer: He was previously followed by Dr. Margette Mercer and was last seen by him on 10/26/2020, at which time he was advised to start melatonin at night.  Sinemet CR was increased to 50-200 mg strength 1 pill 3 times daily and he was on Sinemet immediate release 25-100 mg strength half a pill 3 times daily. I reviewed the note and copied the note below for reference.   He had a brain MRI without contrast on 10/01/2016.  I reviewed the results: IMPRESSION:  This noncontrasted MRI of the brain shows the following: 1.    There are  a few T2/FLAIR hyperintense foci in the hemispheres consistent with minimal age-appropriate chronic microvessel ischemic change. None of these appear to be acute. 2.    Maxillary and ethmoid chronic sinusitis 3.   There are no acute findings    He reports having good days and bad days.  He did not take his morning medications today.  He has not had any recent falls, constipation is under reasonable control with daily MiraLAX.  He has a bowel movement every other day approximately.  He does admit to not doing much physically, he does not exercise on a regular basis, does not feel like doing anything, has been depressed he does not with lack of motivation.  Has not discussed this with his primary care PA yet.  He does not sleep well.  Melatonin has not been very helpful, he does not keep a schedule, tends to stay up late and playing video games.  May be in bed somewhere between midnight and 3 AM and rise time varies between 10 AM and 1 PM.  His wife works.  He does not take his medicine on a consistent schedule.  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  He does not hydrate well with water, drinks diet soda and bottles or cans of about 5 or 6/day on average, very little water.  He takes Sinemet CR and half a pill of the IR somewhere between 9 and 10 AM or as late as 1 PM, second dose around 3 or 4 PM, last dose somewhere between midnight and 3 AM.  It is difficult for him to break the IR pill in half.     In reviewing the chart, he started having symptoms in 2018.  He had right-sided tremors.  He was seen by Dr. Jannifer Mercer on 09/12/2017 and started on Mirapex at the time.     10/26/20 (Dr. Jannifer Mercer):  <<Leonard Mercer is a 66 year old right-handed white male with a history of Parkinson's disease.  He was seen in June 2022, he had gone off of his medications with significant abdominal pain and nausea.  The patient was seen in the emergency room on 07 August 2020 with  fecal impaction.  He has been good about taking MiraLAX daily, keeping up with his bowel movements to prevent severe constipation again.  Once the constipation had been corrected, his nausea and abdominal pain has improved.  His wife canceled his GI appointment.  The patient remains on Sinemet but not at the dose he was prior to getting sick.  He is having some mobility issues, he has not had any falls.  He is having trouble with his sleep pattern, he will sleep to 3 hours at a time and then will get up and move about.  He does this day and  night.  Some days he feels more fatigued than others.  He does not talk much in his sleep, he does not snore.  He comes back to this office for further evaluation.  His swallowing is much better since he has been back on his Sinemet.>>      ROS:   14 system review of systems performed and negative with exception of those listed in HPI  PMH:  Past Medical History:  Diagnosis Date   Chest pain    Diabetes mellitus without complication (HCC)    Hypertension    Kidney stone    Memory disorder 11/07/2018   Parkinson's disease (HCC) 09/12/2016    PSH:  Past Surgical History:  Procedure Laterality Date   HERNIA REPAIR      Social History:  Social History   Socioeconomic History   Marital status: Married    Spouse name: Leonard Mercer   Number of children: 1   Years of education: 18   Highest education level: Not on file  Occupational History   Occupation: [employer]  Tobacco Use   Smoking status: Never   Smokeless tobacco: Never  Vaping Use   Vaping Use: Never used  Substance and Sexual Activity   Alcohol use: Yes   Drug use: No   Sexual activity: Not on file  Other Topics Concern   Not on file  Social History Narrative   Lives with wife   Caffeine use: Diet sodas   Right handed   Social Determinants of Health   Financial Resource Strain: Not on file  Food Insecurity: Not on file  Transportation Needs: Not on file   Physical Activity: Not on file  Stress: Not on file  Social Connections: Not on file  Intimate Partner Violence: Not on file    Family History:  Family History  Problem Relation Age of Onset   Hypertension Mother    Diabetes Father    Heart disease Maternal Grandfather     Medications:   Current Outpatient Medications on File Prior to Visit  Medication Sig Dispense Refill   carbidopa-levodopa (SINEMET CR) 50-200 MG tablet Take 1 tablet by mouth 3 (three) times daily. 270 tablet 1   carbidopa-levodopa (SINEMET IR) 25-100 MG tablet Take 1 tablet by mouth 3 (three) times daily. 270 tablet 0   escitalopram (LEXAPRO) 5 MG tablet Take 1 tablet (5 mg total) by mouth daily. 30 tablet 4   losartan (COZAAR) 50 MG tablet Take 50 mg by mouth daily.     memantine (NAMENDA) 10 MG tablet Take 1 tablet (10 mg total) by mouth 2 (two) times daily. 180 tablet 3   metFORMIN (GLUCOPHAGE) 1000 MG tablet Take 1,000 mg by mouth 2 (two) times daily with a meal.     polyethylene glycol (MIRALAX / GLYCOLAX) 17 g packet Take 17 g by mouth daily.     pravastatin (PRAVACHOL) 20 MG tablet Take 20 mg by mouth daily.     No current facility-administered medications on file prior to visit.    Allergies:   Allergies  Allergen Reactions   Ace Inhibitors Other (See Comments)    unknown   Donepezil Hcl Other (See Comments)    Stomach upset Stomach upset   Sulfamethoxazole-Trimethoprim Nausea And Vomiting   Pravastatin Rash      OBJECTIVE:  Physical Exam  Vitals:   09/20/21 0738  BP: (!) 125/90  Pulse: 67  Weight: 161 lb 4 oz (73.1 kg)  Height: 5\' 11"  (1.803 m)  Body mass index is 22.49 kg/m. No results found.   General: well developed, well nourished, very pleasant middle-age Caucasian male, seated, in no evident distress HEENT: head normocephalic and atraumatic.  Moderate facial masking noted, mild to moderate nuchal rigidity Cardiovascular: regular rate and rhythm, no  murmurs Musculoskeletal: no deformity Skin:  no rash/petichiae Vascular:  Normal pulses all extremities   Neurologic Exam Mental Status: Awake and fully alert.  Speech with mild to moderate hypophonia, no obvious dysarthria.  Oriented to place and time. Recent memory impaired and remote memory intact. Attention span, concentration and fund of knowledge appropriate during visit. Mood and affect appropriate.     05/31/2021    9:43 AM 07/20/2020    7:24 AM 06/26/2019    9:05 AM  MMSE - Mini Mental State Exam  Orientation to time 3 4 3   Orientation to Place 5 4 5   Registration 3 3 3   Attention/ Calculation 5 1 5   Recall 1 2 3   Language- name 2 objects 2 2 2   Language- repeat 1 0 1  Language- follow 3 step command 3 3 3   Language- read & follow direction 1 1 1   Write a sentence 1 1 1   Copy design 1 0 1  Total score 26 21 28    Cranial Nerves: Pupils equal, briskly reactive to light. Extraocular movements full without nystagmus. Visual fields full to confrontation. Hearing intact. Facial sensation intact. Motor: Normal strength in all tested extremity muscles without increased tone. R>L cogwheel rigidity.  RUE: Mild to moderate resting tremor with mild postural tremor and minimal action tremor, unable to appreciate intention tremor.  LUE mild resting tremor.  No evidence of tremor in legs bilaterally. Sensory.: intact to touch , pinprick , position and vibratory sensation.  Coordination: Rapid alternating movements moderate difficulty memory and mild on the left. Finger-to-nose and heel-to-shin performed accurately bilaterally. Gait and Station: Arises from chair with mild difficulty. Stance is stooped. Gait demonstrates decreased stride length and step height bilaterally as well as decreased arm swingv R>L, pill-rolling tremor R>L.  Mild unsteadiness greater with turns.  Ambulates without assistive device.  Tandem walk, heel toe and Romberg not tested for safety reasons Reflexes: 1+ and  symmetric. Toes downgoing.       ASSESSMENT/PLAN: Leonard Mercer is a 66 y.o. year old male    1.  Parkinson's disease  -Progression over time with complications of memory loss, sleep disturbance and constipation  -Continue Sinemet CR 1 pill 3 times daily  -Continue Sinemet IR 1 pill 3 times daily - discussed waking earlier in the morning as he was previously doing in order to take medications at an appropriate time and hopefully increase dosage of 4 times daily (see HPI). Wife wil call if he is able to do this for dosage adjustment   -Increase Lexapro to 10mg  daily for mood  -Continue memantine 10 mg twice daily as memory stable -plan on repeat memory testing at follow-up visit  -Restart melatonin 10 mg nightly for sleep disturbance. Discussed increasing daily activity/exercise and avoidance of daytime napping. Discussed importance of routine sleep/wake cycle and good sleep hygiene  -Continue MiraLAX PRN for constipation     Follow up in 4 months or call earlier if needed    CC:  PCP: ., PA-C    I spent 36 minutes of face-to-face and non-face-to-face time with patient and wife.  This included previsit chart review, lab review, study review, order entry, electronic health record documentation, patient and  wife education regarding Parkinson's disease with current medication use as noted above, medication changes as noted above and answered all other questions to patient and wife satisfaction   Frann Rider, AGNP-BC  [facility] [address] [place], [place] [address]  Phone [phone] Fax [phone] Note: This document was prepared with digital dictation and possible smart phrase technology. Any transcriptional errors that result from this process are unintentional.

## 2021-09-20 ENCOUNTER — Encounter: Payer: Self-pay | Admitting: Adult Health

## 2021-09-20 ENCOUNTER — Ambulatory Visit: Payer: BC Managed Care – PPO | Admitting: Adult Health

## 2021-09-20 VITALS — BP 125/90 | HR 67 | Ht 71.0 in | Wt 161.2 lb

## 2021-09-20 DIAGNOSIS — K5909 Other constipation: Secondary | ICD-10-CM

## 2021-09-20 DIAGNOSIS — R413 Other amnesia: Secondary | ICD-10-CM

## 2021-09-20 DIAGNOSIS — G20 Parkinson's disease: Secondary | ICD-10-CM | POA: Diagnosis not present

## 2021-09-20 DIAGNOSIS — G479 Sleep disorder, unspecified: Secondary | ICD-10-CM

## 2021-09-20 DIAGNOSIS — R4589 Other symptoms and signs involving emotional state: Secondary | ICD-10-CM | POA: Diagnosis not present

## 2021-09-20 MED ORDER — ESCITALOPRAM OXALATE 10 MG PO TABS: 10.0000 mg | ORAL_TABLET | Freq: Every day | ORAL | 5 refills | Status: DC

## 2021-09-20 MED ORDER — MEMANTINE HCL 10 MG PO TABS: 10.0000 mg | ORAL_TABLET | Freq: Two times a day (BID) | ORAL | 3 refills | Status: DC

## 2021-09-20 NOTE — Patient Instructions (Addendum)
Your Plan:  Continue Sinemet IR and CR 3 times daily - please let me know if you are able to start getting up earlier where we can try doing Sinemet 4 times daily   Continue memantine (Namenda) 10 mg twice daily for cognition  Increase Lexapro from 5 mg to 10mg  daily for mood  Restart melatonin to help with sleep - try to take this for at least 2 weeks consistently at night Try to increase daytime activity and avoid day time naps as this can help you sleep better at night  Continued use of Miralax as needed for constipation      Follow up in 4 months or call earlier if needed      Thank you for coming to see at [facility]. I hope we have been able to provide you high quality care today.  You may receive a patient satisfaction survey over the next few weeks. We would appreciate your feedback and comments so that we may continue to improve ourselves and the health of our patients.

## 2021-12-09 IMAGING — CT CT ABD-PELV W/ CM
2 of 5 series · 16 of 46 positions shown, 18 images · IV contrast (Omnipaque or Isovue)
Comparison: 07/20/2014

CLINICAL DATA: Constipation, bowel obstruction

EXAM:
CT ABDOMEN AND PELVIS WITH CONTRAST
TECHNIQUE: Multidetector CT imaging of the abdomen and pelvis was performed
using the standard protocol following bolus administration of
intravenous contrast.
CONTRAST:  100mL OMNIPAQUE IOHEXOL 300 MG/ML  SOLN

[Series 2: axial st · axial · 0.74mm/px · z∈[-937,-502]mm · 13 of 97 slices shown, 15 images]
[im 5/97  soft-tissue]
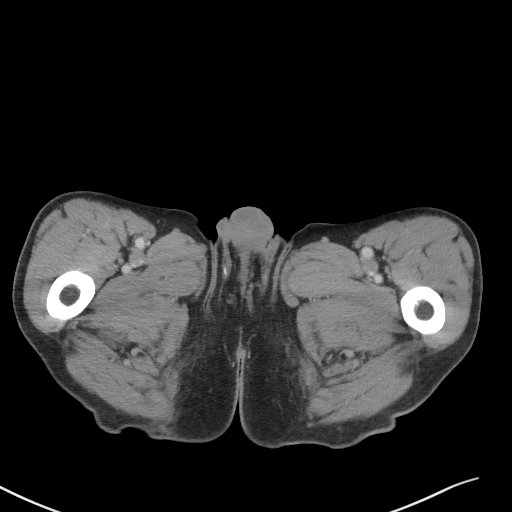
[im 5/97  bone]
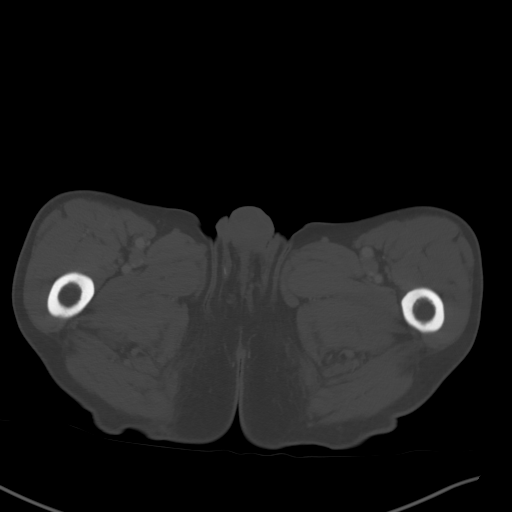
[im 14/97  soft-tissue]
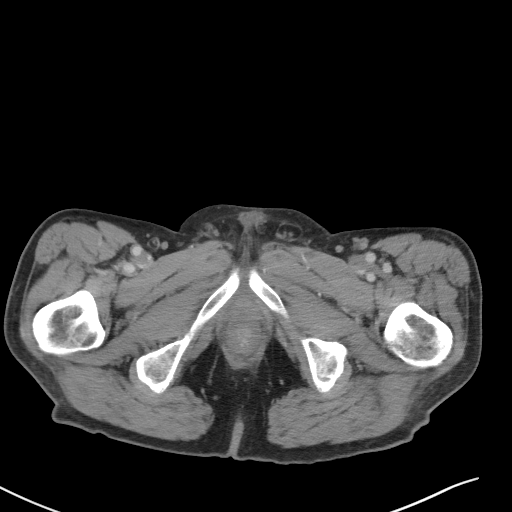
[im 19/97  soft-tissue]
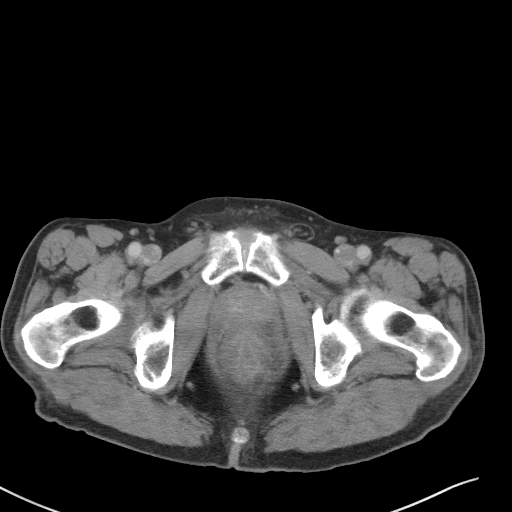
[im 28/97  soft-tissue]
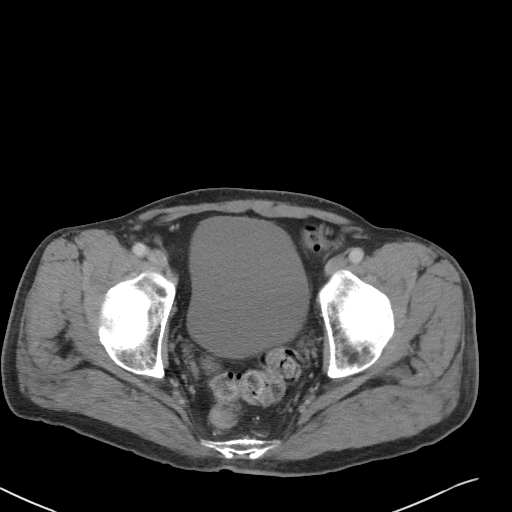
[im 33/97  soft-tissue]
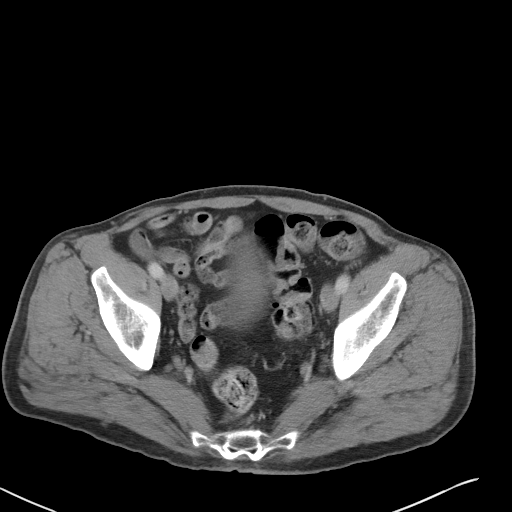
[im 42/97  soft-tissue]
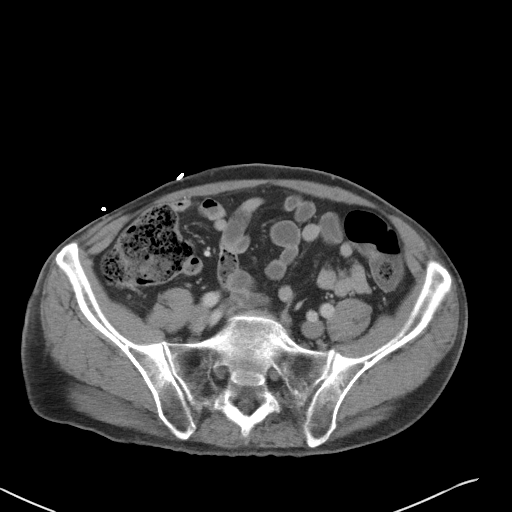
[im 51/97  soft-tissue]
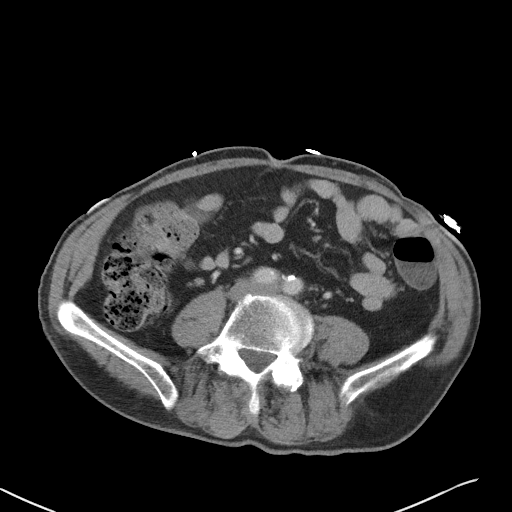
[im 55/97  soft-tissue]
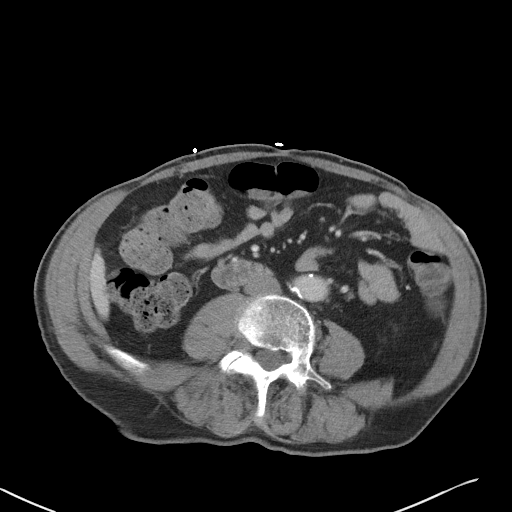
[im 65/97  soft-tissue]
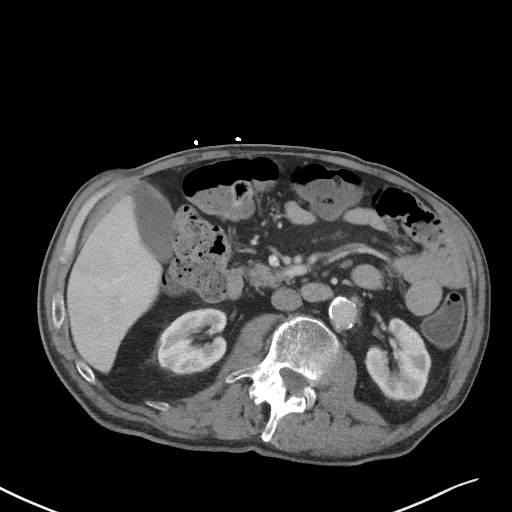
[im 65/97  bone]
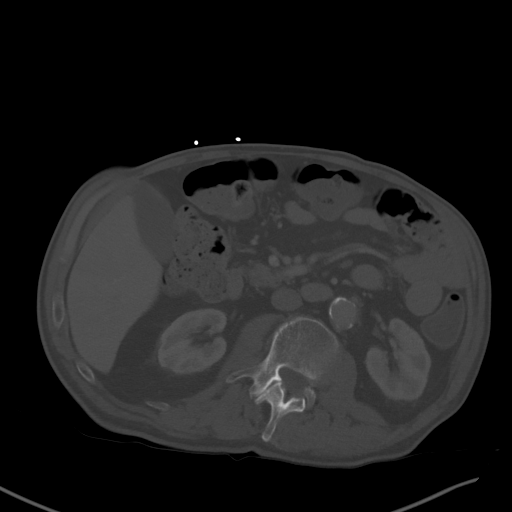
[im 69/97  soft-tissue]
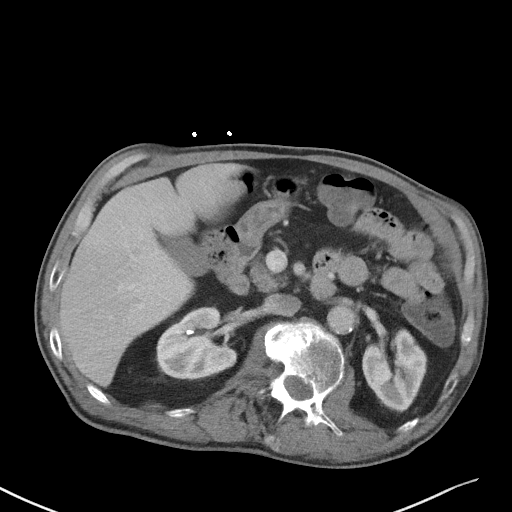
[im 78/97  soft-tissue]
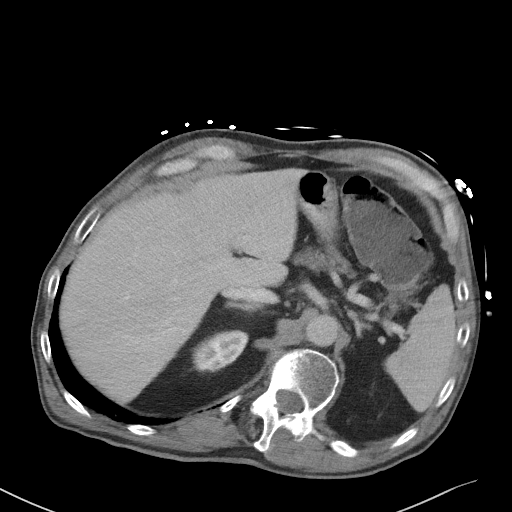
[im 83/97  soft-tissue]
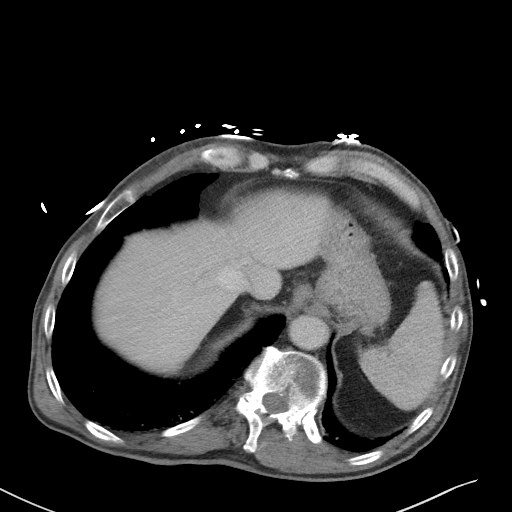
[im 92/97  soft-tissue]
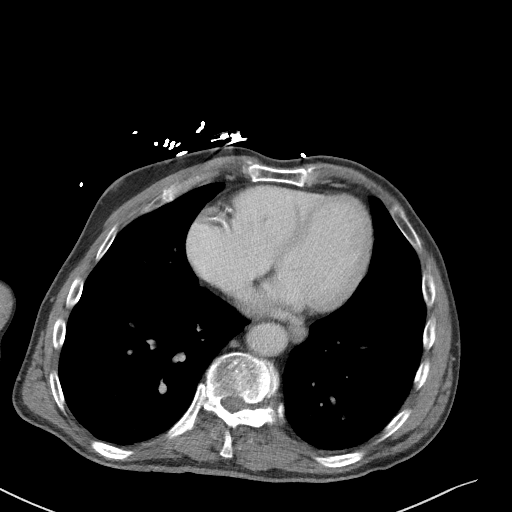

[Series 5: coronal st · coronal · 0.75mm/px · 3 of 85 slices shown]
[im 29/85  soft-tissue]
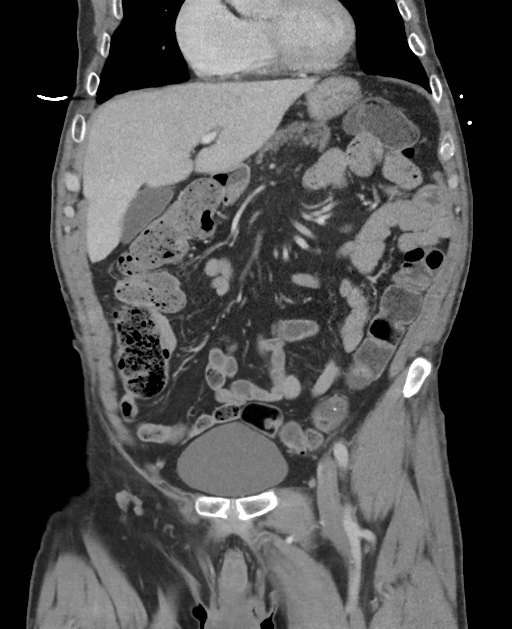
[im 38/85  soft-tissue]
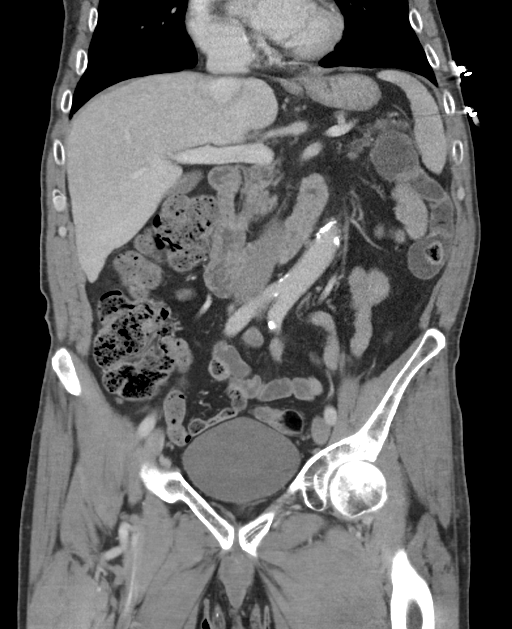
[im 47/85  soft-tissue]
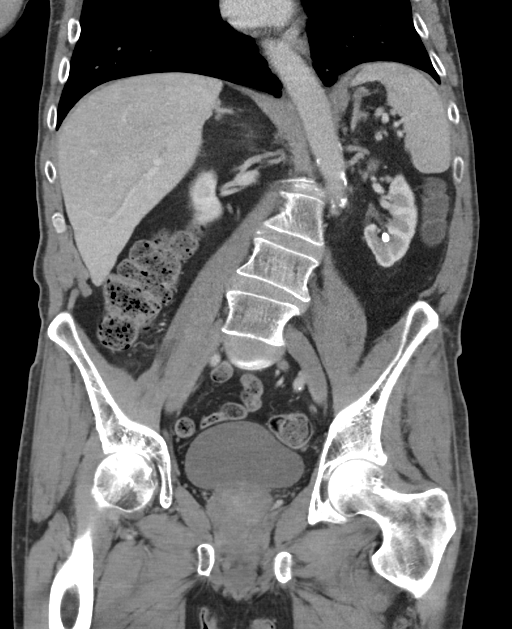

[16 of 46 positions shown; findings below may reference images not displayed]

FINDINGS: Lower chest: The visualized lung bases are clear bilaterally.
Moderate right coronary artery calcification. Global cardiac size
within normal limits.

Hepatobiliary: No focal liver abnormality is seen. No gallstones,
gallbladder wall thickening, or biliary dilatation.

Pancreas: Unremarkable

Spleen: Unremarkable

Adrenals/Urinary Tract: The adrenal glands are unremarkable. The
kidneys are normal in size and position. No enhancing intrarenal
mass. There are bilateral nonobstructing renal calculi with a single
5 mm renal calculus identified within the interpolar region of the
right kidney and 2 nonobstructing calculi within the left kidney
measuring up to 9 mm within the lower pole. The stone burden appears
slightly progressive since prior examination. No hydronephrosis. No
ureteral calculi. The bladder is unremarkable.

Stomach/Bowel: There is moderate stool seen throughout the colon
without evidence of obstruction. Relatively small volume stool noted
within the rectal vault. The stomach, small bowel, and large bowel
are otherwise unremarkable. No evidence of obstruction or focal
inflammation. Appendix normal. No free intraperitoneal gas or
fluid.

Vascular/Lymphatic: Moderate aortoiliac atherosclerotic
calcification. No aortic aneurysm. No pathologic adenopathy within
the abdomen and pelvis.

Reproductive: Prostate is unremarkable.

Other: No abdominal wall hernia.

Musculoskeletal: Moderate thoracolumbar levoscoliosis, apex left at
L1. No acute bone abnormality. No lytic or blastic bone lesions
identified.
IMPRESSION: Moderate stool throughout the colon without evidence of obstruction.

Moderate right coronary artery calcification.

Mild bilateral nonobstructing nephrolithiasis. Slight interval
progression of stone burden since prior examination. No
hydronephrosis. No urolithiasis.

Moderate thoracolumbar levoscoliosis.

Aortic Atherosclerosis ([id number]).

## 2022-01-20 NOTE — Progress Notes (Signed)
[facility] [address] [place]. [place] [address] [phone]       OFFICE FOLLOW UP NOTE  Mr. WRIGHT GRAVELY Date of Birth:  1955/10/17 Medical Record Number:  [id number]    Primary neurologist: Dr. Frances Furbish Reason for visit: Parkinson's disease    SUBJECTIVE:   CHIEF COMPLAINT:  Chief Complaint  Patient presents with   Room 2    Room 2. Pt is here Alone. Pt states that he hasn't experience any new symptoms since last visit. Pt states that everything is the same.     HPI:   Mr. Lever is a very pleasant 66 year old right-handed gentleman with an underlying medical history of diabetes, hypertension, kidney stones, memory loss, and history of chest pain, who presents for follow-up consultation of his parkinsonism, complicated by constipation, memory loss, and sleep disturbance. First seen by Dr. Frances Furbish 03/08/2021, increased Sinemet IR to 1 pill 3 times daily and continue Sinemet CR 1 pill 3 times daily, melatonin, and memantine.  At prior visit 05/31/2021, Dr. Frances Furbish recommend initiating Lexapro 5 mg daily for depression and continued current dose of Sinemet and Namenda.     Update 01/24/2022 JM: Patient returns for follow-up visit unaccompanied  Remains on Sinemet IR and CR 3 times daily, takes around 10am, 3:30pm and 9:30pm Denies any wearing off times, denies side effects Feels like his gait has been about the same since prior visit, denies any recent falls Feels like his hands are weaker at times, more noticeable when he tries to play video games, feels like he can't use the controllers as well. He does have stress balls at home but does not use them.   Mood has been good, remains on Lexapro 10 mg daily  Memory stable, remains on Namenda 10 mg twice daily MMSE today 25/30 (prior 26/30)  Occasional issues with constipation, uses MiraLAX as needed with benefit    Update 09/16/2021 JM:  Continues on Sinemet IR and CR 3 times daily -Currently, taking  around 12-1pm, 3-4pm and 6-7pm. He has been sleeping longer usually waking up around 11 AM. Previously was setting an alarm clock to take his medication earlier but has not been recently doing this. -believes tremor has worsened some since prior visit  did have 1 fall since prior visit - caught his right toes going up stairs thankfully without injury. No other fall. Gait/balance can worsen later in the day.  Admits to being sedentary with limited daytime activity or exercise and watches TV majority of the day.  Denies any issues with swallowing. Does have occasional issues with constipation, will use MiraLAX as needed.   Cognition relatively stable since prior visit.  Remains on memantine 10 mg twice daily.  Denies side effects. Does have some difficulty finding the correct word.  No behavioral related concerns.  Use of Lexapro 5 mg daily with some improvement of mood but has not seen any significant difference.  Denies any side effects.  Continues to have difficulty with insomnia, he has not been using melatonin.  Typically falls asleep around 2-3AM.  Once he is able to fall asleep, he is able to stay asleep.   No further concerns at this time      History provided for reference purposes only 05/31/2021 Dr. Frances Furbish: He reports not feeling motivated to do a whole lot.  He would like to be able to do more physical activity and outdoor activities.  He is more sedentary.  He is taking Sinemet IR and CR 3 times  a day, generally first dose around 9:30 AM or 10 AM, second dose around 2 PM and third dose around 9 or 10 PM.  He likes to play games on the computer.  He has tried melatonin 20 mg at night but had daytime grogginess the next day.  He has melatonin Gummies and each is 10 mg strength.  He has not tried it consistently at only 10 mg.  He has tried hemp Gummies, half a gummy at night which helps him calm down and sleep a little better he feels.  He has ongoing issues with constipation, takes  MiraLAX as needed, every other day or every third day.  He has not fallen.  He does endorse depressive symptoms, has never been on any antidepressant, has not discussed this with his primary care provider either.     03/08/21 Dr. Rexene Alberts: He was previously followed by Dr. Margette Fast and was last seen by him on 10/26/2020, at which time he was advised to start melatonin at night.  Sinemet CR was increased to 50-200 mg strength 1 pill 3 times daily and he was on Sinemet immediate release 25-100 mg strength half a pill 3 times daily. I reviewed the note and copied the note below for reference.   He had a brain MRI without contrast on 10/01/2016.  I reviewed the results: IMPRESSION:  This noncontrasted MRI of the brain shows the following: 1.    There are a few T2/FLAIR hyperintense foci in the hemispheres consistent with minimal age-appropriate chronic microvessel ischemic change. None of these appear to be acute. 2.    Maxillary and ethmoid chronic sinusitis 3.   There are no acute findings    He reports having good days and bad days.  He did not take his morning medications today.  He has not had any recent falls, constipation is under reasonable control with daily MiraLAX.  He has a bowel movement every other day approximately.  He does admit to not doing much physically, he does not exercise on a regular basis, does not feel like doing anything, has been depressed he does not with lack of motivation.  Has not discussed this with his primary care PA yet.  He does not sleep well.  Melatonin has not been very helpful, he does not keep a schedule, tends to stay up late and playing video games.  May be in bed somewhere between midnight and 3 AM and rise time varies between 10 AM and 1 PM.  His wife works.  He does not take his medicine on a consistent schedule.  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  He does not hydrate well with water, drinks diet soda  and bottles or cans of about 5 or 6/day on average, very little water.  He takes Sinemet CR and half a pill of the IR somewhere between 9 and 10 AM or as late as 1 PM, second dose around 3 or 4 PM, last dose somewhere between midnight and 3 AM.  It is difficult for him to break the IR pill in half.     In reviewing the chart, he started having symptoms in 2018.  He had right-sided tremors.  He was seen by Dr. Jannifer Franklin on 09/12/2017 and started on Mirapex at the time.     10/26/20 (Dr. Jannifer Franklin):  <<Mr. Monterrosa is a 66 year old right-handed white male with a history of Parkinson's disease.  He was  seen in June 2022, he had gone off of his medications with significant abdominal pain and nausea.  The patient was seen in the emergency room on 07 August 2020 with fecal impaction.  He has been good about taking MiraLAX daily, keeping up with his bowel movements to prevent severe constipation again.  Once the constipation had been corrected, his nausea and abdominal pain has improved.  His wife canceled his GI appointment.  The patient remains on Sinemet but not at the dose he was prior to getting sick.  He is having some mobility issues, he has not had any falls.  He is having trouble with his sleep pattern, he will sleep to 3 hours at a time and then will get up and move about.  He does this day and night.  Some days he feels more fatigued than others.  He does not talk much in his sleep, he does not snore.  He comes back to this office for further evaluation.  His swallowing is much better since he has been back on his Sinemet.>>      ROS:   14 system review of systems performed and negative with exception of those listed in HPI  PMH:  Past Medical History:  Diagnosis Date   Chest pain    Diabetes mellitus without complication ([place])    Hypertension    Kidney stone    Memory disorder 11/07/2018   Parkinson's disease ([place]) 09/12/2016    PSH:  Past Surgical History:  Procedure Laterality Date   HERNIA  REPAIR      Social History:  Social History   Socioeconomic History   Marital status: Married    Spouse name: Lovey Newcomer   Number of children: 1   Years of education: 18   Highest education level: Not on file  Occupational History   Occupation: [employer]  Tobacco Use   Smoking status: Never   Smokeless tobacco: Never  Vaping Use   Vaping Use: Never used  Substance and Sexual Activity   Alcohol use: Yes   Drug use: No   Sexual activity: Not on file  Other Topics Concern   Not on file  Social History Narrative   Lives with wife   Caffeine use: Diet sodas   Right handed   Social Determinants of Health   Financial Resource Strain: Not on file  Food Insecurity: Not on file  Transportation Needs: Not on file  Physical Activity: Not on file  Stress: Not on file  Social Connections: Not on file  Intimate Partner Violence: Not on file    Family History:  Family History  Problem Relation Age of Onset   Hypertension Mother    Diabetes Father    Heart disease Maternal Grandfather     Medications:   Current Outpatient Medications on File Prior to Visit  Medication Sig Dispense Refill   carbidopa-levodopa (SINEMET CR) 50-200 MG tablet Take 1 tablet by mouth 3 (three) times daily. 270 tablet 1   carbidopa-levodopa (SINEMET IR) 25-100 MG tablet Take 1 tablet by mouth 3 (three) times daily. 270 tablet 0   escitalopram (LEXAPRO) 10 MG tablet Take 1 tablet (10 mg total) by mouth daily. 30 tablet 5   losartan (COZAAR) 50 MG tablet Take 50 mg by mouth daily.     Melatonin 10 MG TABS Take 10 mg by mouth at bedtime.     memantine (NAMENDA) 10 MG tablet Take 1 tablet (10 mg total) by mouth 2 (two) times daily.  180 tablet 3   metFORMIN (GLUCOPHAGE) 1000 MG tablet Take 1,000 mg by mouth 2 (two) times daily with a meal.     polyethylene glycol (MIRALAX / GLYCOLAX) 17 g packet Take 17 g by mouth daily.     pravastatin (PRAVACHOL) 20 MG tablet Take 20 mg by mouth daily.      No current facility-administered medications on file prior to visit.    Allergies:   Allergies  Allergen Reactions   Ace Inhibitors Other (See Comments)    unknown   Donepezil Hcl Other (See Comments)    Stomach upset Stomach upset   Sulfamethoxazole-Trimethoprim Nausea And Vomiting   Pravastatin Rash      OBJECTIVE:  Physical Exam  Vitals:   01/24/22 0727  BP: (!) 143/93  Pulse: 70  Weight: 168 lb (76.2 kg)  Height: 5\' 11"  (1.803 m)   Body mass index is 23.43 kg/m. No results found.  General: well developed, well nourished, very pleasant middle-age Caucasian male, seated, in no evident distress HEENT: head normocephalic and atraumatic.  Moderate facial masking noted, mild to moderate nuchal rigidity Cardiovascular: regular rate and rhythm, no murmurs Musculoskeletal: no deformity Skin:  no rash/petichiae Vascular:  Normal pulses all extremities   Neurologic Exam Mental Status: Awake and fully alert.  Speech with mild to moderate hypophonia, no obvious dysarthria.  Oriented to place and time. Recent memory impaired and remote memory intact. Attention span, concentration and fund of knowledge appropriate during visit. Mood and affect appropriate.     01/24/2022    7:49 AM 05/31/2021    9:43 AM 07/20/2020    7:24 AM  MMSE - Mini Mental State Exam  Orientation to time 4 3 4   Orientation to Place 5 5 4   Registration 3 3 3   Attention/ Calculation 1 5 1   Recall 3 1 2   Language- name 2 objects 2 2 2   Language- repeat 1 1 0  Language- follow 3 step command 3 3 3   Language- read & follow direction 1 1 1   Write a sentence 1 1 1   Copy design 1 1 0  Total score 25 26 21    Cranial Nerves: Pupils equal, briskly reactive to light. Extraocular movements full without nystagmus. Visual fields full to confrontation. Hearing intact. Facial sensation intact. Motor: Normal strength in all tested extremity muscles except mild hand weakness bilaterally. R>L cogwheel rigidity.   RUE: Mild to moderate resting tremor with mild postural tremor and minimal action tremor, unable to appreciate intention tremor.  LUE mild resting tremor.  No evidence of tremor in legs bilaterally. Sensory.: intact to touch , pinprick , position and vibratory sensation.  Coordination: Rapid alternating movements moderate difficulty memory and mild on the left. Finger-to-nose and heel-to-shin performed accurately bilaterally. Gait and Station: Arises from chair with mild difficulty. Stance is stooped. Gait demonstrates decreased stride length and step height bilaterally as well as decreased arm swing R>L, pill-rolling tremor R>L.  Mild unsteadiness greater with turns.  Ambulates without assistive device.  Tandem walk, heel toe and Romberg not tested for safety reasons Reflexes: 1+ and symmetric. Toes downgoing.       ASSESSMENT/PLAN: YOSEPH BUCHANAN is a 66 y.o. year old male    1.  Parkinson's disease  -Progression over time with complications of memory loss, sleep disturbance and constipation  -Continue Sinemet CR 1 pill 3 times daily  -Continue Sinemet IR 1 pill 3 times daily  -discussed participation with PT/OT - will further consider and call office if  interested   -Continue Lexapro to 10mg  daily for mood  -Continue memantine 10 mg twice daily as memory stable -plan on repeat memory testing at follow-up visit  -continue melatonin 10 mg nightly for sleep disturbance. Discussed increasing daily activity/exercise and avoidance of daytime napping. Discussed importance of routine sleep/wake cycle and good sleep hygiene  -Continue MiraLAX PRN for constipation     Follow up in 6 months with Dr. Rexene Alberts or call earlier if needed    CC:  PCP: Aletha Halim., PA-C    I spent 31 minutes of face-to-face and non-face-to-face time with patient.  This included previsit chart review, lab review, study review, order entry, electronic health record documentation, patient education regarding  Parkinson's disease with current medication use as noted above, and answered all other questions to patient satisfaction  Frann Rider, [facility]  [facility] [address] [place], [place] [address]  Phone [phone] Fax [phone] Note: This document was prepared with digital dictation and possible smart phrase technology. Any transcriptional errors that result from this process are unintentional.

## 2022-01-24 ENCOUNTER — Ambulatory Visit: Payer: BC Managed Care – PPO | Admitting: Adult Health

## 2022-01-24 ENCOUNTER — Encounter: Payer: Self-pay | Admitting: Adult Health

## 2022-01-24 VITALS — BP 143/93 | HR 70 | Ht 71.0 in | Wt 168.0 lb

## 2022-01-24 DIAGNOSIS — G20A1 Parkinson's disease without dyskinesia, without mention of fluctuations: Secondary | ICD-10-CM

## 2022-01-24 NOTE — Patient Instructions (Addendum)
Your Plan:  Continue current dosages of Sinemet immediate release and controlled release  Consider working with physical and occupational therapy - please let me know if you are interested   Continue Lexapro at current dosage  Continue Namenda at current dosage    Follow-up with Dr. Frances Furbish in 6 months or call earlier if needed     Thank you for coming to see [place] at [facility]. I hope we have been able to provide you high quality care today.  You may receive a patient satisfaction survey over the next few weeks. We would appreciate your feedback and comments so that we may continue to improve ourselves and the health of our patients.

## 2022-01-26 ENCOUNTER — Other Ambulatory Visit: Payer: Self-pay

## 2022-01-26 ENCOUNTER — Other Ambulatory Visit: Payer: Self-pay | Admitting: Neurology

## 2022-01-26 ENCOUNTER — Telehealth: Payer: Self-pay | Admitting: Adult Health

## 2022-01-26 NOTE — Telephone Encounter (Signed)
Approved.  

## 2022-01-26 NOTE — Telephone Encounter (Signed)
Pt is calling. Requesting his refill for medication  carbidopa-levodopa (SINEMET IR) 25-100 MG tablet be sent to pharmacy today. Refill should be sent to [facility] [phone]

## 2022-01-30 ENCOUNTER — Other Ambulatory Visit: Payer: Self-pay

## 2022-01-30 ENCOUNTER — Encounter (HOSPITAL_COMMUNITY): Payer: Self-pay

## 2022-01-30 ENCOUNTER — Emergency Department (HOSPITAL_COMMUNITY)
Admission: EM | Admit: 2022-01-30 | Discharge: 2022-01-30 | Disposition: A | Discharge status: Home / Self Care | Payer: BC Managed Care – PPO | Attending: Emergency Medicine | Admitting: Emergency Medicine

## 2022-01-30 DIAGNOSIS — U071 COVID-19: Secondary | ICD-10-CM | POA: Insufficient documentation

## 2022-01-30 DIAGNOSIS — R059 Cough, unspecified: Secondary | ICD-10-CM | POA: Diagnosis present

## 2022-01-30 HISTORY — DX: Pure hypercholesterolemia, unspecified: E78.00

## 2022-01-30 MED ORDER — GUAIFENESIN-DM 100-10 MG/5ML PO SYRP: 5.0000 mL | ORAL_SOLUTION | ORAL | 0 refills | Status: DC | PRN

## 2022-01-30 MED ORDER — SALINE SPRAY 0.65 % NA SOLN
2.0000 | Freq: Once | NASAL | Status: AC
  Administered 2022-01-30: 2 via NASAL
  Filled 2022-01-30: qty 44

## 2022-01-30 MED ORDER — GUAIFENESIN-DM 100-10 MG/5ML PO SYRP
5.0000 mL | ORAL_SOLUTION | Freq: Once | ORAL | Status: AC
  Administered 2022-01-30: 5 mL via ORAL
  Filled 2022-01-30: qty 5

## 2022-01-30 MED ORDER — OXYMETAZOLINE HCL 0.05 % NA SOLN: 1.0000 | Freq: Two times a day (BID) | NASAL | 0 refills | Status: DC | PRN

## 2022-01-30 NOTE — ED Triage Notes (Signed)
Pt arrived via POV c/o strong, cough accompanied with COVID infection. Pt reports testing positive on Thursday at Urgent Care and has taken his 2nd dose of Paxlovid up to this point. Pts 2 Sats 97%-98% on Room Air in Triage.

## 2022-01-30 NOTE — ED Provider Notes (Signed)
[facility] EMERGENCY DEPARTMENT Provider Note   CSN: [id number] Arrival date & time: 01/30/22  0134     History {Add pertinent medical, surgical, social history, OB history to HPI:1} Chief Complaint  Patient presents with   Cough    Leonard Mercer is a 66 y.o. male.   Cough      Home Medications Prior to Admission medications   Medication Sig Start Date End Date Taking? Authorizing Provider  guaiFENesin-dextromethorphan (ROBITUSSIN DM) 100-10 MG/5ML syrup Take 5 mLs by mouth every 4 (four) hours as needed for cough. 01/30/22  Yes Hutch Rhett, Barbara Cower, MD  oxymetazoline (AFRIN NASAL SPRAY) 0.05 % nasal spray Place 1 spray into both nostrils 2 (two) times daily as needed for congestion (if the normal saline isn't helping). 01/30/22  Yes Carlia Bomkamp, Barbara Cower, MD  carbidopa-levodopa (SINEMET CR) 50-200 MG tablet Take 1 tablet by mouth 3 (three) times daily. 05/31/21   Huston Foley, MD  carbidopa-levodopa (SINEMET IR) 25-100 MG tablet TAKE 1 TABLET BY MOUTH THREE TIMES DAILY 01/26/22   Ihor Austin, NP  escitalopram (LEXAPRO) 10 MG tablet Take 1 tablet (10 mg total) by mouth daily. 09/20/21   Ihor Austin, NP  losartan (COZAAR) 50 MG tablet Take 50 mg by mouth daily. 08/21/20   [provider]  Melatonin 10 MG TABS Take 10 mg by mouth at bedtime.    [provider]  memantine (NAMENDA) 10 MG tablet Take 1 tablet (10 mg total) by mouth 2 (two) times daily. 09/20/21   Ihor Austin, NP  metFORMIN (GLUCOPHAGE) 1000 MG tablet Take 1,000 mg by mouth 2 (two) times daily with a meal.    [provider]  polyethylene glycol (MIRALAX / GLYCOLAX) 17 g packet Take 17 g by mouth daily.    [provider]  pravastatin (PRAVACHOL) 20 MG tablet Take 20 mg by mouth daily.    [provider]      Allergies    Ace inhibitors, Donepezil hcl, Sulfamethoxazole-trimethoprim, and Pravastatin    Review of Systems   Review of Systems  Respiratory:  Positive for cough.      Physical Exam Updated Vital Signs BP 134/89   Pulse 81   Temp 98.4 F (36.9 C) (Oral)   Resp 17   Ht 5\' 11"  (1.803 m)   Wt 76.2 kg   SpO2 96%   BMI 23.43 kg/m  Physical Exam  ED Results / Procedures / Treatments   Labs (all labs ordered are listed, but only abnormal results are displayed) Labs Reviewed - No data to display  EKG None  Radiology No results found.  Procedures Procedures  {Document cardiac monitor, telemetry assessment procedure when appropriate:1}  Medications Ordered in ED Medications  sodium chloride (OCEAN) 0.65 % nasal spray 2 spray (2 sprays Each Nare Given 01/30/22 0359)  guaiFENesin-dextromethorphan (ROBITUSSIN DM) 100-10 MG/5ML syrup 5 mL (5 mLs Oral Given 01/30/22 0359)    ED Course/ Medical Decision Making/ A&P                           Medical Decision Making Risk OTC drugs.   ***  {Document critical care time when appropriate:1} {Document review of labs and clinical decision tools ie heart score, Chads2Vasc2 etc:1}  {Document your independent review of radiology images, and any outside records:1} {Document your discussion with family members, caretakers, and with consultants:1} {Document social determinants of health affecting pt's care:1} {Document your decision making why or why not admission, treatments  were needed:1} Final Clinical Impression(s) / ED Diagnoses Final diagnoses:  COVID-19    Rx / DC Orders ED Discharge Orders          Ordered    guaiFENesin-dextromethorphan (ROBITUSSIN DM) 100-10 MG/5ML syrup  Every 4 hours PRN        01/30/22 0530    oxymetazoline (AFRIN NASAL SPRAY) 0.05 % nasal spray  2 times daily PRN        01/30/22 0530

## 2022-05-02 ENCOUNTER — Telehealth: Payer: Self-pay | Admitting: Adult Health

## 2022-05-02 DIAGNOSIS — G20A1 Parkinson's disease without dyskinesia, without mention of fluctuations: Secondary | ICD-10-CM

## 2022-05-02 DIAGNOSIS — G20C Parkinsonism, unspecified: Secondary | ICD-10-CM

## 2022-05-02 NOTE — Telephone Encounter (Signed)
Pt called stating that his tremors are getting worse especially in his R hand and is wanting to know what can be advised to him to help with that. He also would like to know if he can start P.T. for this and if so he would like to know if he can do it at the [facility] if they take his insurance. Please advise.

## 2022-05-02 NOTE — Telephone Encounter (Signed)
Called the patient and there was no answer.LVM asking the patient to give a call back.   ** When the patient calls back please confirm he is still taking the carbidopa levodopa IR 25/100 mg tablet, 1 tablet three times a day as well as the Carbidopa/levodopa CR 50/200, 1 tablet three times a day.  We can likely place a Physical therapy referral for him to where he is requested but wouldn't likely know if their insurance is accepted there until we send it over to them. If they don't accept his insurance they will let us know and then we would have to try another place.

## 2022-05-03 NOTE — Telephone Encounter (Signed)
Pt. Called and was informed of this information. Pt wants to know if something can be done about the medication to help with his Tremors. Pt verbalized understanding of the information given to him concerning his PT.

## 2022-05-04 NOTE — Telephone Encounter (Signed)
Lets try physical therapy first but if tremors continue to worsen, can try to increase in Sinemet dosage. Thank you.

## 2022-05-04 NOTE — Addendum Note (Signed)
Addended by: Gerline Legacy C on: 05/04/2022 10:00 AM   Modules accepted: Orders

## 2022-05-04 NOTE — Telephone Encounter (Signed)
Pt called back. Informed him of message nurse [facility] sent. Pt said that will work for him and he will call Janett Billow to update her after he starts PT.

## 2022-05-05 ENCOUNTER — Telehealth: Payer: Self-pay | Admitting: Adult Health

## 2022-05-05 NOTE — Telephone Encounter (Signed)
Referral for physical therapy fax to [facility]. Phone: [phone], Fax: [phone]

## 2022-05-12 ENCOUNTER — Telehealth: Payer: Self-pay | Admitting: Adult Health

## 2022-05-12 NOTE — Telephone Encounter (Signed)
Please see previous phone message.3/20

## 2022-05-12 NOTE — Telephone Encounter (Signed)
Received a call today,05/12/22 "Pt is asking for a call to discuss a request for a stronger dose in his tremor medication(pt does not recall the name of the medication"  Per prev phone discussion,05/04/2022 Leonard Mercer recommended the patient complete some physical therapy first prior to increasing the medication.

## 2022-05-12 NOTE — Telephone Encounter (Signed)
Pt is asking for a call to discuss a request for a stronger dose in his tremor medication(pt does not recall the name of the medication).

## 2022-07-10 ENCOUNTER — Other Ambulatory Visit: Payer: Self-pay

## 2022-07-10 ENCOUNTER — Emergency Department (HOSPITAL_COMMUNITY)
Admission: EM | Admit: 2022-07-10 | Discharge: 2022-07-10 | Disposition: A | Discharge status: Home / Self Care | Payer: BC Managed Care – PPO | Attending: Emergency Medicine | Admitting: Emergency Medicine

## 2022-07-10 ENCOUNTER — Encounter (HOSPITAL_COMMUNITY): Payer: Self-pay | Admitting: *Deleted

## 2022-07-10 ENCOUNTER — Emergency Department (HOSPITAL_COMMUNITY): Payer: BC Managed Care – PPO

## 2022-07-10 DIAGNOSIS — N23 Unspecified renal colic: Secondary | ICD-10-CM | POA: Diagnosis not present

## 2022-07-10 DIAGNOSIS — N201 Calculus of ureter: Secondary | ICD-10-CM | POA: Diagnosis not present

## 2022-07-10 DIAGNOSIS — R109 Unspecified abdominal pain: Secondary | ICD-10-CM | POA: Diagnosis present

## 2022-07-10 LAB — URINALYSIS, ROUTINE W REFLEX MICROSCOPIC
Bacteria, UA: NONE SEEN
Bilirubin Urine: NEGATIVE
Glucose, UA: NEGATIVE mg/dL
Ketones, ur: 5 mg/dL — AB
Leukocytes,Ua: NEGATIVE
Nitrite: NEGATIVE
Protein, ur: NEGATIVE mg/dL
Specific Gravity, Urine: 1.016 (ref 1.005–1.030)
pH: 5 (ref 5.0–8.0)

## 2022-07-10 LAB — CBC WITH DIFFERENTIAL/PLATELET
Abs Immature Granulocytes: 0.03 10*3/uL (ref 0.00–0.07)
Basophils Absolute: 0 10*3/uL (ref 0.0–0.1)
Basophils Relative: 1 %
Eosinophils Absolute: 0.1 10*3/uL (ref 0.0–0.5)
Eosinophils Relative: 2 %
HCT: 43.3 % (ref 39.0–52.0)
Hemoglobin: 14.5 g/dL (ref 13.0–17.0)
Immature Granulocytes: 1 %
Lymphocytes Relative: 28 %
Lymphs Abs: 1.8 10*3/uL (ref 0.7–4.0)
MCH: 30.7 pg (ref 26.0–34.0)
MCHC: 33.5 g/dL (ref 30.0–36.0)
MCV: 91.5 fL (ref 80.0–100.0)
Monocytes Absolute: 0.5 10*3/uL (ref 0.1–1.0)
Monocytes Relative: 7 %
Neutro Abs: 4 10*3/uL (ref 1.7–7.7)
Neutrophils Relative %: 61 %
Platelets: 191 10*3/uL (ref 150–400)
RBC: 4.73 MIL/uL (ref 4.22–5.81)
RDW: 12.3 % (ref 11.5–15.5)
WBC: 6.5 10*3/uL (ref 4.0–10.5)
nRBC: 0 % (ref 0.0–0.2)

## 2022-07-10 LAB — BASIC METABOLIC PANEL
Anion gap: 12 (ref 5–15)
BUN: 23 mg/dL (ref 8–23)
CO2: 22 mmol/L (ref 22–32)
Calcium: 8.7 mg/dL — ABNORMAL LOW (ref 8.9–10.3)
Chloride: 104 mmol/L (ref 98–111)
Creatinine, Ser: 1.03 mg/dL (ref 0.61–1.24)
GFR, Estimated: >60 mL/min (ref >60)
Glucose, Bld: 153 mg/dL — ABNORMAL HIGH (ref 70–99)
Potassium: 4.1 mmol/L (ref 3.5–5.1)
Sodium: 138 mmol/L (ref 135–145)

## 2022-07-10 MED ORDER — OXYCODONE-ACETAMINOPHEN 5-325 MG PO TABS: 1.0000 | ORAL_TABLET | Freq: Four times a day (QID) | ORAL | 0 refills | Status: DC | PRN

## 2022-07-10 MED ORDER — ONDANSETRON HCL 4 MG PO TABS: 4.0000 mg | ORAL_TABLET | Freq: Three times a day (TID) | ORAL | 0 refills | Status: DC | PRN

## 2022-07-10 MED ORDER — MORPHINE SULFATE (PF) 4 MG/ML IV SOLN
4.0000 mg | Freq: Once | INTRAVENOUS | Status: AC
  Administered 2022-07-10: 4 mg via INTRAVENOUS
  Filled 2022-07-10: qty 1

## 2022-07-10 MED ORDER — SODIUM CHLORIDE 0.9 % IV BOLUS
500.0000 mL | Freq: Once | INTRAVENOUS | Status: AC
  Administered 2022-07-10: 500 mL via INTRAVENOUS

## 2022-07-10 MED ORDER — ONDANSETRON HCL 4 MG/2ML IJ SOLN
4.0000 mg | Freq: Once | INTRAMUSCULAR | Status: AC
  Administered 2022-07-10: 4 mg via INTRAVENOUS
  Filled 2022-07-10: qty 2

## 2022-07-10 NOTE — Discharge Instructions (Addendum)
You are seen in the emergency department for right-sided back pain.  You had lab work and a CAT scan, urinalysis.  You have a 7 mm kidney stone on the right side about halfway down from the kidney to the bladder.  This may pass on its own and we are sending you home with a prescription for some nausea medicine and pain medicine.  Please drink plenty of fluids.  Call [facility] on Tuesday for close follow-up.  Return to the emergency department if any high fevers or uncontrolled pain.

## 2022-07-10 NOTE — ED Provider Notes (Signed)
[place] EMERGENCY DEPARTMENT AT [facility] Provider Note   CSN: [id number] Arrival date & time: 07/10/22  8657     History  No chief complaint on file.   Leonard Mercer is a 67 y.o. male.  He is presenting with complaint of right flank pain that is been going on and off for a few days but acutely worse today.  He said he has had a kidney stone in the past and it feels like that.  Rates the pain as severe and unable to find a position of comfort.  Associated with nausea.  He denies any trauma or fevers no abdominal pain no urinary symptoms.  Has a history of Parkinson's.  Not on any blood thinners.  The history is provided by the patient.  Flank Pain This is a recurrent problem. The current episode started more than 2 days ago. The problem occurs daily. The problem has been rapidly worsening. Pertinent negatives include no chest pain, no abdominal pain, no headaches and no shortness of breath. Nothing aggravates the symptoms. Nothing relieves the symptoms. He has tried rest for the symptoms. The treatment provided no relief.       Home Medications Prior to Admission medications   Medication Sig Start Date End Date Taking? Authorizing Provider  carbidopa-levodopa (SINEMET CR) 50-200 MG tablet Take 1 tablet by mouth 3 (three) times daily. 05/31/21   Huston Foley, MD  carbidopa-levodopa (SINEMET IR) 25-100 MG tablet TAKE 1 TABLET BY MOUTH THREE TIMES DAILY 01/26/22   Ihor Austin, NP  escitalopram (LEXAPRO) 10 MG tablet Take 1 tablet (10 mg total) by mouth daily. 09/20/21   Ihor Austin, NP  guaiFENesin-dextromethorphan (ROBITUSSIN DM) 100-10 MG/5ML syrup Take 5 mLs by mouth every 4 (four) hours as needed for cough. 01/30/22   Mesner, Barbara Cower, MD  losartan (COZAAR) 50 MG tablet Take 50 mg by mouth daily. 08/21/20   [provider]  Melatonin 10 MG TABS Take 10 mg by mouth at bedtime.    [provider]  memantine (NAMENDA) 10 MG tablet Take 1 tablet (10 mg  total) by mouth 2 (two) times daily. 09/20/21   Ihor Austin, NP  metFORMIN (GLUCOPHAGE) 1000 MG tablet Take 1,000 mg by mouth 2 (two) times daily with a meal.    [provider]  oxymetazoline (AFRIN NASAL SPRAY) 0.05 % nasal spray Place 1 spray into both nostrils 2 (two) times daily as needed for congestion (if the normal saline isn't helping). 01/30/22   Mesner, Barbara Cower, MD  polyethylene glycol (MIRALAX / GLYCOLAX) 17 g packet Take 17 g by mouth daily.    [provider]  pravastatin (PRAVACHOL) 20 MG tablet Take 20 mg by mouth daily.    [provider]      Allergies    Ace inhibitors, Donepezil hcl, Sulfamethoxazole-trimethoprim, and Pravastatin    Review of Systems   Review of Systems  Constitutional:  Negative for fever.  Respiratory:  Negative for shortness of breath.   Cardiovascular:  Negative for chest pain.  Gastrointestinal:  Positive for nausea. Negative for abdominal pain.  Genitourinary:  Positive for flank pain.  Musculoskeletal:  Positive for back pain.  Neurological:  Negative for headaches.    Physical Exam Updated Vital Signs BP (!) 162/109 (BP Location: Left Arm)   Pulse 64   Temp 97.9 F (36.6 C) (Oral)   Resp 16   SpO2 98%  Physical Exam Vitals and nursing note reviewed.  Constitutional:      General:  He is not in acute distress.    Appearance: Normal appearance. He is well-developed.  HENT:     Head: Normocephalic and atraumatic.  Eyes:     Conjunctiva/sclera: Conjunctivae normal.  Cardiovascular:     Rate and Rhythm: Normal rate and regular rhythm.     Heart sounds: No murmur heard. Pulmonary:     Effort: Pulmonary effort is normal. No respiratory distress.     Breath sounds: Normal breath sounds.  Abdominal:     Palpations: Abdomen is soft.     Tenderness: There is no abdominal tenderness. There is no guarding or rebound.  Musculoskeletal:        General: No deformity. Normal range of motion.     Cervical back: Neck  supple.  Skin:    General: Skin is warm and dry.     Capillary Refill: Capillary refill takes less than 2 seconds.  Neurological:     General: No focal deficit present.     Mental Status: He is alert.     Comments: He has some upper extremity tremor     ED Results / Procedures / Treatments   Labs (all labs ordered are listed, but only abnormal results are displayed) Labs Reviewed  BASIC METABOLIC PANEL - Abnormal; Notable for the following components:      Result Value   Glucose, Bld 153 (*)    Calcium 8.7 (*)    All other components within normal limits  URINALYSIS, ROUTINE W REFLEX MICROSCOPIC - Abnormal; Notable for the following components:   Hgb urine dipstick SMALL (*)    Ketones, ur 5 (*)    All other components within normal limits  CBC WITH DIFFERENTIAL/PLATELET    EKG None  Radiology CT Renal Stone Study  Result Date: 07/10/2022 CLINICAL DATA:  Abdominal/flank pain with stone suspected EXAM: CT ABDOMEN AND PELVIS WITHOUT CONTRAST TECHNIQUE: Multidetector CT imaging of the abdomen and pelvis was performed following the standard protocol without IV contrast. RADIATION DOSE REDUCTION: This exam was performed according to the departmental dose-optimization program which includes automated exposure control, adjustment of the mA and/or kV according to patient size and/or use of iterative reconstruction technique. COMPARISON:  08/08/2020 FINDINGS: Lower chest:  RCA atheromatous calcification. Hepatobiliary: No focal liver abnormality.No evidence of biliary obstruction or stone. Pancreas: Generalized atrophy and fatty infiltration Spleen: Unremarkable. Adrenals/Urinary Tract: Negative adrenals. Mild right hydronephrosis and upper hydroureter due to a 7 mm stone in the ureter at the level of L4-5. Three left renal calculi measuring up to 9 mm. Unremarkable bladder. Stomach/Bowel: No obstruction. No visible bowel inflammation. Distal colonic diverticulosis. Vascular/Lymphatic: No  acute vascular abnormality. No mass or adenopathy. Reproductive:No pathologic findings. Other: No ascites or pneumoperitoneum. Musculoskeletal: No acute abnormalities. Lumbar spine degeneration and scoliosis. IMPRESSION: 1. Obstructing right mid ureteral calculus which measures 7 mm. 2. Multiple left renal calculi. 3. Colonic diverticulosis. Electronically Signed   By: Tiburcio Pea M.D.   On: 07/10/2022 08:44    Procedures Procedures    Medications Ordered in ED Medications  morphine (PF) 4 MG/ML injection 4 mg (has no administration in time range)  ondansetron (ZOFRAN) injection 4 mg (has no administration in time range)  sodium chloride 0.9 % bolus 500 mL (has no administration in time range)    ED Course/ Medical Decision Making/ A&P Clinical Course as of 07/10/22 1845  Sun Jul 10, 2022  9147 CT interpreted by me as right mid ureteral stone.  Awaiting radiology reading. [MB]    Clinical Course  User Index [MB] Terrilee Files, MD                             Medical Decision Making Amount and/or Complexity of Data Reviewed Labs: ordered. Radiology: ordered.  Risk Prescription drug management.   This patient complains of right flank pain; this involves an extensive number of treatment Options and is a complaint that carries with it a high risk of complications and morbidity. The differential includes musculoskeletal, pyelonephritis, kidney stone, cholelithiasis, cholecystitis  I ordered, reviewed and interpreted labs, which included CBC normal chemistries normal other than mildly elevated glucose, urinalysis without signs of infection I ordered medication IV pain medication and fluids, nausea medication and reviewed PMP when indicated. I ordered imaging studies which included CT renal and I independently    visualized and interpreted imaging which showed 7 mm stone Previous records obtained and reviewed in epic no recent admissions Cardiac monitoring reviewed, normal  sinus rhythm Social determinants considered, no significant barriers Critical Interventions: None  After the interventions stated above, I reevaluated the patient and found patient's pain to be improved. Admission and further testing considered, no indications for admission but will need close follow-up with urology.  7 mm is unlikely to pass and will likely need outpatient intervention.  Normal renal function no fever no white count so do not feel he needs admission at this time.  Return instructions discussed.         Final Clinical Impression(s) / ED Diagnoses Final diagnoses:  Renal colic on right side  Ureterolithiasis    Rx / DC Orders ED Discharge Orders          Ordered    oxyCODONE-acetaminophen (PERCOCET/ROXICET) 5-325 MG tablet  Every 6 hours PRN,   Status:  Discontinued        07/10/22 1005    ondansetron (ZOFRAN) 4 MG tablet  Every 8 hours PRN,   Status:  Discontinued        07/10/22 1005    ondansetron (ZOFRAN) 4 MG tablet  Every 8 hours PRN        07/10/22 1011    oxyCODONE-acetaminophen (PERCOCET/ROXICET) 5-325 MG tablet  Every 6 hours PRN        07/10/22 1011              Terrilee Files, MD 07/10/22 1846

## 2022-07-10 NOTE — ED Triage Notes (Signed)
Pt c/o right side flank pain x 3 days; pt has hx of kidney stones; no urinary symptoms

## 2022-07-12 ENCOUNTER — Other Ambulatory Visit: Payer: Self-pay | Admitting: Urology

## 2022-07-13 ENCOUNTER — Other Ambulatory Visit: Payer: Self-pay

## 2022-07-13 ENCOUNTER — Encounter (HOSPITAL_COMMUNITY): Payer: Self-pay | Admitting: Urology

## 2022-07-13 NOTE — Progress Notes (Signed)
For Anesthesia: PCP - Richmond Campbell, PA-C   Cardiologist - N/A Neurologist- McCue, Shanda Bumps, NP/ Huston Foley, MD   Chest x-ray - N/A EKG - N/A Stress Test - N/A ECHO - greater than 2 years in [facility] Cardiac Cath - N/A Pacemaker/ICD device last checked: N/A Pacemaker orders received: N/A Device Rep notified: N/A  Spinal Cord Stimulator: N/A  Sleep Study - N/A CPAP - N/A  Fasting Blood Sugar - N/A Checks Blood Sugar ___as needed__ times a day Date and result of last Hgb A1c-05/12/22 (8.3)  Last dose of GLP1 agonist- N/A GLP1 instructions: N/A  Last dose of SGLT-2 inhibitors- N/A SGLT-2 instructions:N/A  Blood Thinner Instructions:N/A Aspirin Instructions:N/A Last Dose:N/A  Activity level: Can go up a flight of stairs and activities of daily living without stopping and without chest pain and/or shortness of breath    Anesthesia review: HTN, DM  Patient denies shortness of breath, fever, cough and chest pain during pre op phone call.   Patient verbalized understanding of instructions reviewed via telephone.

## 2022-07-15 ENCOUNTER — Ambulatory Visit (HOSPITAL_COMMUNITY): Payer: BC Managed Care – PPO | Admitting: Physician Assistant

## 2022-07-15 ENCOUNTER — Encounter (HOSPITAL_COMMUNITY)
Admission: RE | Disposition: A | Discharge status: Home / Self Care | Payer: Self-pay | Source: Ambulatory Visit | Attending: Urology

## 2022-07-15 ENCOUNTER — Ambulatory Visit (HOSPITAL_COMMUNITY)
Admission: RE | Admit: 2022-07-15 | Discharge: 2022-07-15 | Disposition: A | Discharge status: Home / Self Care | Payer: BC Managed Care – PPO | Source: Ambulatory Visit | Attending: Urology | Admitting: Urology

## 2022-07-15 ENCOUNTER — Ambulatory Visit (HOSPITAL_COMMUNITY): Payer: BC Managed Care – PPO

## 2022-07-15 ENCOUNTER — Encounter (HOSPITAL_COMMUNITY): Payer: Self-pay | Admitting: Urology

## 2022-07-15 DIAGNOSIS — E119 Type 2 diabetes mellitus without complications: Secondary | ICD-10-CM | POA: Insufficient documentation

## 2022-07-15 DIAGNOSIS — I10 Essential (primary) hypertension: Secondary | ICD-10-CM | POA: Diagnosis not present

## 2022-07-15 DIAGNOSIS — N202 Calculus of kidney with calculus of ureter: Secondary | ICD-10-CM | POA: Insufficient documentation

## 2022-07-15 HISTORY — PX: CYSTOSCOPY/URETEROSCOPY/HOLMIUM LASER/STENT PLACEMENT: SHX6546

## 2022-07-15 HISTORY — DX: Other complications of anesthesia, initial encounter: T88.59XA

## 2022-07-15 HISTORY — DX: Personal history of COVID-19: Z86.16

## 2022-07-15 LAB — GLUCOSE, CAPILLARY
Glucose-Capillary: 163 mg/dL — ABNORMAL HIGH (ref 70–99)
Glucose-Capillary: 139 mg/dL — ABNORMAL HIGH (ref 70–99)

## 2022-07-15 SURGERY — CYSTOSCOPY/URETEROSCOPY/HOLMIUM LASER/STENT PLACEMENT
Anesthesia: General | Laterality: Right

## 2022-07-15 MED ORDER — FENTANYL CITRATE PF 50 MCG/ML IJ SOSY
PREFILLED_SYRINGE | INTRAMUSCULAR | Status: AC
  Filled 2022-07-15: qty 1

## 2022-07-15 MED ORDER — PROPOFOL 10 MG/ML IV BOLUS
INTRAVENOUS | Status: DC | PRN
  Administered 2022-07-15: 200 mg via INTRAVENOUS

## 2022-07-15 MED ORDER — CEFAZOLIN SODIUM-DEXTROSE 2-4 GM/100ML-% IV SOLN
2.0000 g | INTRAVENOUS | Status: AC
  Administered 2022-07-15: 2 g via INTRAVENOUS
  Filled 2022-07-15: qty 100

## 2022-07-15 MED ORDER — ONDANSETRON HCL 4 MG/2ML IJ SOLN
4.0000 mg | Freq: Once | INTRAMUSCULAR | Status: AC | PRN
  Administered 2022-07-15: 4 mg via INTRAVENOUS

## 2022-07-15 MED ORDER — LIDOCAINE 2% (20 MG/ML) 5 ML SYRINGE
INTRAMUSCULAR | Status: DC | PRN
  Administered 2022-07-15: 100 mg via INTRAVENOUS

## 2022-07-15 MED ORDER — PROPOFOL 10 MG/ML IV BOLUS
INTRAVENOUS | Status: AC
  Filled 2022-07-15: qty 20

## 2022-07-15 MED ORDER — SODIUM CHLORIDE 0.9 % IR SOLN
Status: DC | PRN
  Administered 2022-07-15: 3000 mL

## 2022-07-15 MED ORDER — KETOROLAC TROMETHAMINE 30 MG/ML IJ SOLN
30.0000 mg | Freq: Once | INTRAMUSCULAR | Status: AC | PRN
  Administered 2022-07-15: 30 mg via INTRAVENOUS

## 2022-07-15 MED ORDER — LIDOCAINE HCL (PF) 2 % IJ SOLN
INTRAMUSCULAR | Status: AC
  Filled 2022-07-15: qty 5

## 2022-07-15 MED ORDER — DEXAMETHASONE SODIUM PHOSPHATE 10 MG/ML IJ SOLN
INTRAMUSCULAR | Status: AC
  Filled 2022-07-15: qty 1

## 2022-07-15 MED ORDER — PHENYLEPHRINE 80 MCG/ML (10ML) SYRINGE FOR IV PUSH (FOR BLOOD PRESSURE SUPPORT)
PREFILLED_SYRINGE | INTRAVENOUS | Status: DC | PRN
  Administered 2022-07-15 (×4): 160 ug via INTRAVENOUS

## 2022-07-15 MED ORDER — LACTATED RINGERS IV SOLN: INTRAVENOUS | Status: DC

## 2022-07-15 MED ORDER — ONDANSETRON HCL 4 MG/2ML IJ SOLN
INTRAMUSCULAR | Status: DC | PRN
  Administered 2022-07-15: 4 mg via INTRAVENOUS

## 2022-07-15 MED ORDER — FENTANYL CITRATE PF 50 MCG/ML IJ SOSY
PREFILLED_SYRINGE | INTRAMUSCULAR | Status: AC
  Administered 2022-07-15: 25 ug via INTRAVENOUS
  Filled 2022-07-15: qty 2

## 2022-07-15 MED ORDER — CHLORHEXIDINE GLUCONATE 0.12 % MT SOLN
15.0000 mL | Freq: Once | OROMUCOSAL | Status: AC
  Administered 2022-07-15: 15 mL via OROMUCOSAL

## 2022-07-15 MED ORDER — FENTANYL CITRATE (PF) 100 MCG/2ML IJ SOLN
INTRAMUSCULAR | Status: DC | PRN
  Administered 2022-07-15 (×4): 25 ug via INTRAVENOUS

## 2022-07-15 MED ORDER — ONDANSETRON HCL 4 MG/2ML IJ SOLN: INTRAMUSCULAR | Status: DC | PRN

## 2022-07-15 MED ORDER — FENTANYL CITRATE PF 50 MCG/ML IJ SOSY
25.0000 ug | PREFILLED_SYRINGE | INTRAMUSCULAR | Status: DC | PRN
  Administered 2022-07-15 (×2): 50 ug via INTRAVENOUS
  Administered 2022-07-15: 25 ug via INTRAVENOUS

## 2022-07-15 MED ORDER — MIDAZOLAM HCL 2 MG/2ML IJ SOLN
INTRAMUSCULAR | Status: AC
  Filled 2022-07-15: qty 2

## 2022-07-15 MED ORDER — ONDANSETRON HCL 4 MG/2ML IJ SOLN
INTRAMUSCULAR | Status: AC
  Filled 2022-07-15: qty 2

## 2022-07-15 MED ORDER — OXYCODONE HCL 5 MG/5ML PO SOLN: 5.0000 mg | Freq: Once | ORAL | Status: AC | PRN

## 2022-07-15 MED ORDER — OXYCODONE HCL 5 MG PO TABS: 5.0000 mg | ORAL_TABLET | Freq: Once | ORAL | Status: AC | PRN

## 2022-07-15 MED ORDER — FENTANYL CITRATE (PF) 100 MCG/2ML IJ SOLN
INTRAMUSCULAR | Status: AC
  Filled 2022-07-15: qty 2

## 2022-07-15 MED ORDER — KETOROLAC TROMETHAMINE 30 MG/ML IJ SOLN
INTRAMUSCULAR | Status: AC
  Filled 2022-07-15: qty 1

## 2022-07-15 MED ORDER — ORAL CARE MOUTH RINSE: 15.0000 mL | Freq: Once | OROMUCOSAL | Status: AC

## 2022-07-15 MED ORDER — OXYCODONE HCL 5 MG PO TABS
ORAL_TABLET | ORAL | Status: AC
  Administered 2022-07-15: 5 mg via ORAL
  Filled 2022-07-15: qty 1

## 2022-07-15 SURGICAL SUPPLY — 25 items
BAG URO CATCHER STRL LF (MISCELLANEOUS) ×1 IMPLANT
BASKET LASER NITINOL 1.9FR (BASKET) IMPLANT
BASKET ZERO TIP NITINOL 2.4FR (BASKET) IMPLANT
BSKT STON RTRVL 120 1.9FR (BASKET)
BSKT STON RTRVL ZERO TP 2.4FR (BASKET) ×1
CATH URETERAL DUAL LUMEN 10F (MISCELLANEOUS) IMPLANT
CATH URETL OPEN END 6FR 70 (CATHETERS) ×1 IMPLANT
CLOTH BEACON ORANGE TIMEOUT ST (SAFETY) ×1 IMPLANT
EXTRACTOR STONE 1.7FRX115CM (UROLOGICAL SUPPLIES) IMPLANT
GLOVE BIO SURGEON STRL SZ7.5 (GLOVE) ×1 IMPLANT
GOWN STRL REUS W/ TWL XL LVL3 (GOWN DISPOSABLE) ×1 IMPLANT
GOWN STRL REUS W/TWL XL LVL3 (GOWN DISPOSABLE) ×1
GUIDEWIRE ANG ZIPWIRE 038X150 (WIRE) IMPLANT
GUIDEWIRE STR DUAL SENSOR (WIRE) ×1 IMPLANT
KIT TURNOVER KIT A (KITS) IMPLANT
LASER FIB FLEXIVA PULSE ID 365 (Laser) IMPLANT
MANIFOLD NEPTUNE II (INSTRUMENTS) ×1 IMPLANT
PACK CYSTO (CUSTOM PROCEDURE TRAY) ×1 IMPLANT
SHEATH NAVIGATOR HD 11/13X28 (SHEATH) IMPLANT
SHEATH NAVIGATOR HD 11/13X36 (SHEATH) IMPLANT
STENT URET 6FRX26 CONTOUR (STENTS) IMPLANT
TRACTIP FLEXIVA PULS ID 200XHI (Laser) IMPLANT
TRACTIP FLEXIVA PULSE ID 200 (Laser) ×1
TUBING CONNECTING 10 (TUBING) ×1 IMPLANT
TUBING UROLOGY SET (TUBING) ×1 IMPLANT

## 2022-07-15 NOTE — Discharge Instructions (Signed)

## 2022-07-15 NOTE — Anesthesia Postprocedure Evaluation (Signed)
Anesthesia Post Note  Patient: HASSEN REEL  Procedure(s) Performed: CYSTOSCOPY RIGHT URETEROSCOPY/HOLMIUM LASER/STENT PLACEMENT (Right)     Patient location during evaluation: PACU Anesthesia Type: General Level of consciousness: awake and alert Pain management: pain level controlled Vital Signs Assessment: post-procedure vital signs reviewed and stable Respiratory status: spontaneous breathing, nonlabored ventilation, respiratory function stable and patient connected to nasal cannula oxygen Cardiovascular status: blood pressure returned to baseline and stable Postop Assessment: no apparent nausea or vomiting Anesthetic complications: no  No notable events documented.  Last Vitals:  Vitals:   07/15/22 1118  BP: (!) 130/92  Pulse: (!) 124  Resp: 18  Temp: 37.1 C  SpO2: 98%    Last Pain:  Vitals:   07/15/22 1123  TempSrc:   PainSc: 0-No pain                 Cherice Glennie S

## 2022-07-15 NOTE — Anesthesia Preprocedure Evaluation (Addendum)
Anesthesia Evaluation  Patient identified by MRN, date of birth, ID band Patient awake    Reviewed: Allergy & Precautions, H&P , NPO status , Patient's Chart, lab work & pertinent test results  Airway Mallampati: IV  TM Distance: <3 FB Neck ROM: Limited    Dental no notable dental hx.    Pulmonary neg pulmonary ROS   Pulmonary exam normal breath sounds clear to auscultation       Cardiovascular hypertension, Pt. on medications Normal cardiovascular exam Rhythm:Regular Rate:Normal     Neuro/Psych Parkinsons dz  Neuromuscular disease  negative psych ROS   GI/Hepatic negative GI ROS, Neg liver ROS,,,  Endo/Other  diabetes, Type 2    Renal/GU negative Renal ROS  negative genitourinary   Musculoskeletal negative musculoskeletal ROS (+)    Abdominal   Peds negative pediatric ROS (+)  Hematology negative hematology ROS (+)   Anesthesia Other Findings   Reproductive/Obstetrics negative OB ROS                             Anesthesia Physical Anesthesia Plan  ASA: 3  Anesthesia Plan: General   Post-op Pain Management: Minimal or no pain anticipated   Induction: Intravenous  PONV Risk Score and Plan: 2 and Ondansetron, Dexamethasone and Treatment may vary due to age or medical condition  Airway Management Planned: LMA  Additional Equipment:   Intra-op Plan:   Post-operative Plan: Extubation in OR  Informed Consent: I have reviewed the patients History and Physical, chart, labs and discussed the procedure including the risks, benefits and alternatives for the proposed anesthesia with the patient or authorized representative who has indicated his/her understanding and acceptance.     Dental advisory given  Plan Discussed with: CRNA and Surgeon  Anesthesia Plan Comments:        Anesthesia Quick Evaluation

## 2022-07-15 NOTE — Op Note (Signed)
Operative Note  Preoperative diagnosis:  1.  Right ureteral calculus  Postoperative diagnosis: 1.  Right ureteral calculus  Procedure(s): 1.  Cystoscopy with right retrograde pyelogram, right ureteroscopy with laser lithotripsy and stone basketing, ureteral stent placement  Surgeon: Modena Slater, MD  Assistants: None  Anesthesia: General  Complications: A stone was pushed extraluminal from the ureter.  No complete ureteral injury  EBL: Minimal  Specimens: 1.  Ureteral calculus  Drains/Catheters: 1.  6 x 26 double-J ureteral stent  Intraoperative findings: 1.  Normal anterior urethra 2.  Moderately obstructing prostate 3.  Bladder mucosa without any tumors or masses 4.  Right ureteroscopy revealed a proximal right ureteral calculus.  This was difficult to access and to gain good purchase on with the semirigid scope followed by the digital scope but was able to fragment and basket extract.  During treatment of the stone, one of the stone fragments was pushed extraluminal laterally in the ureter.  Maintained access with a wire and remainder of ureter remained intact without complete injury.  Retrograde pyelogram showed no filling defect or hydronephrosis in the kidney.  Indication: 67 year old male with a right ureteral calculus presents for the previously mentioned operation.  Description of procedure:  The patient was identified and consent was obtained.  The patient was taken to the operating room and placed in the supine position.  The patient was placed under general anesthesia.  Perioperative antibiotics were administered.  The patient was placed in dorsal lithotomy.  Patient was prepped and draped in a standard sterile fashion and a timeout was performed.  A 21 French rigid cystoscope was advanced into the urethra and into the bladder.  Complete cystoscopy was performed with findings noted above.  The right ureter was cannulated with a sensor wire which was advanced up to the  kidney under fluoroscopic guidance.  Semirigid ureteroscopy was performed alongside the wire.  I had a difficult time navigating the scope due to the goal of the ureter and I passed a second wire through the scope and this allowed for easy access up to the stone.  I remove the wire but was not able to achieve good purchase on the stone.  I therefore reintroduced the wire through the scope and into the kidney and withdrew the scope.  I reintroduced the ureteroscope alongside the 2 wires but still could not gain good purchase so I advanced an 11 x 13 ureteral access sheath over the wire under continuous fluoroscopic guidance up to the proximal ureter.  Inner sheath and wire were withdrawn.  Digital ureteroscopy was performed.  The location was difficult to obtain good purchase still.  However, I was able to fragment the stone to smaller fragments followed by basket extraction.  I did note that after extracting several stones there appeared to be a luminal defect laterally.  One of the stones had been pushed extraluminal on that right side.  The wire maintained access to the kidney and I was easily able to pass the scope into the true lumen and into the kidney and shot a retrograde pyelogram through the scope with findings noted above.  I withdrew the scope visualizing the ureter upon removal and again I noted there to be a luminal defect on the right but medial, anterior, and posterior portion of the ureter was completely intact.  I withdrew the scope and no other ureteral calculi were seen or ureteral injury.  I backloaded the wire onto the rigid cystoscope and advanced that into the bladder followed  by routine placement of a 6 x 26 double-J ureteral stent.  Fluoroscopy confirmed proximal placement and direct visualization confirmed a good coil within the bladder.  I drained the bladder and withdrew the scope.  Patient tolerated the procedure well was stable postoperatively.  Plan: Follow-up in 1 month for stent  removal I would like to keep the stent longer given the lateral injury from the stone.

## 2022-07-15 NOTE — H&P (Signed)
CC/HPI: CC: Right ureteral calculus  HPI:  07/12/2022  Patient went to the emergency department on 5/26 with severe right-sided flank pain. Has a history of nephrolithiasis x 2. Never required any intervention. Underwent a CT stone protocol that revealed a 7 mm obstructing right mid ureteral calculus as well as multiple left renal calculi. He continues to have significant right-sided abdominal pain. Denies any fever, chills. Takes Zofran to help keep the pain medication down.     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   Tobacco Use Assessment Completed: Used Tobacco in last 30 days? Has never drank.  Drinks 4+ caffeinated drinks per day. Patient's occupation Pharmacist, community.    REVIEW OF SYSTEMS:    GU Review Male:   Patient reports get up at night to urinate and erection problems. Patient denies frequent urination, hard to postpone urination, burning/ pain with urination, leakage of urine, stream starts and stops, trouble starting your stream, have to  strain to urinate , and penile pain.  Gastrointestinal (Upper):   Patient reports nausea. Patient denies vomiting and indigestion/ heartburn.  Gastrointestinal (Lower):   Patient denies diarrhea and constipation.  Constitutional:   Patient denies fever, night sweats, weight loss, and fatigue.  Skin:   Patient denies skin rash/ lesion and itching.  Eyes:   Patient denies blurred vision and double vision.  Ears/ Nose/ Throat:   Patient denies sore throat and sinus problems.  Hematologic/Lymphatic:   Patient denies swollen glands and easy bruising.  Cardiovascular:   Patient denies leg swelling and chest pains.  Respiratory:   Patient denies cough and shortness of breath.  Endocrine:   Patient denies excessive thirst.  Musculoskeletal:   Patient reports back pain. Patient denies joint pain.  Neurological:   Patient denies headaches and dizziness.  Psychologic:   Patient denies depression and anxiety.   VITAL SIGNS:      07/12/2022 03:38 PM  Weight 160 lb / 72.57 kg  Height 71 in / 180.34 cm  BP 126/85 mmHg  Heart Rate 76 /min  Temperature 97.9 F / 36.6 C  BMI 22.3 kg/m   MULTI-SYSTEM PHYSICAL EXAMINATION:    Constitutional: Well-nourished. No physical deformities. Normally developed. Good grooming.  Respiratory: No labored breathing, no use of accessory muscles.   Cardiovascular: Normal temperature, normal extremity pulses, no swelling, no varicosities.  Skin: No paleness, no jaundice, no cyanosis. No lesion, no ulcer, no rash.  Neurologic / Psychiatric: Oriented to time, oriented to place, oriented to person. No depression, no anxiety, no agitation. Has parkinsonian tremor  Gastrointestinal: No mass, no tenderness, no rigidity, non obese abdomen.  Eyes: Normal conjunctivae. Normal eyelids.  Musculoskeletal: Normal gait and station of head and neck.     Complexity of Data:  Source Of History:  Patient  Records Review:   Previous Doctor Records, Previous Patient Records  Urine Test  Review:   Urinalysis  X-Ray Review: C.T. Abdomen/Pelvis: Reviewed Films. Reviewed Report. Discussed With Patient.     PROCEDURES:         KUB - [id number]  A single view of the abdomen is obtained.  Radiopaque calculus in the right mid to distal ureter redemonstrated. Also has a 1 cm left renal calculus.      . Patient confirmed No Neulasta OnPro Device.           Urinalysis - 81003 Dipstick Dipstick Cont'd  Color: Yellow Bilirubin: Neg  Appearance: Clear Ketones: Neg  Specific Gravity: 1.025 Blood: Neg  pH: <=5.0 Protein: Neg  Glucose: Neg Urobilinogen: 1.0    Nitrites: Neg    Leukocyte Esterase: Neg    Notes:            Ketoralac 60mg  - [id number], [id number] ZERO WASTED   Qty: 60 Adm. By: Tawnya Crook  Unit: mg Lot No [id number]  Route: IM Exp. Date 05/16/2023  Freq: None Mfgr.:   Site: Left Hip   ASSESSMENT:      ICD-10 Details  1 GU:   Ureteral calculus - N20.1 Undiagnosed New Problem  2   Ureteral obstruction secondary to calculous - N13.2 Undiagnosed New Problem  3   Renal colic - N23 Undiagnosed New Problem   PLAN:            Medications New Meds: Ondansetron Odt 4 mg tablet,disintegrating 1 tablet PO Q 8 H PRN nausea  #20  1 Refill(s)  Oxycodone Hcl 5 mg tablet 1 tablet PO Q 6 H PRN For pain  #15  0 Refill(s)  Pharmacy Name:  [id number]  Address:  [address]   [place], [place] [id number]  Phone:  [phone]  Fax:  [phone]            Orders Labs Urine Culture  X-Rays: KUB          Schedule         Document Letter(s):  Created for Patient: Clinical Summary         Notes:   We discussed the management of urinary stones. These options include observation, ureteroscopy, and shockwave lithotripsy. We discussed which options are relevant to these particular stones. We discussed the natural history of stones as well as the complications of untreated stones and the impact on quality of life without treatment as well as with each of  the above listed treatments. We also discussed the efficacy of each treatment in its ability to clear the stone burden. With any of these management options I discussed the signs and symptoms of infection and the need for emergent treatment should these be experienced. For each option we discussed the ability of each procedure to clear the patient of their stone burden.   For observation I described the risks which include but are not limited to silent renal damage, life-threatening infection, need for emergent surgery, failure to pass stone, and pain.   For ureteroscopy I described the risks which include heart attack, stroke, pulmonary embolus, death, bleeding, infection, damage to contiguous structures, positioning injury, ureteral stricture, ureteral avulsion, ureteral injury, need for ureteral stent, inability to perform ureteroscopy, need for an interval procedure, inability to clear stone burden, stent discomfort and pain.  For shockwave lithotripsy I described the risks which include arrhythmia, kidney contusion, kidney hemorrhage, need for transfusion, pain, inability to break up stone, inability to pass stone fragments, Steinstrasse, infection associated with obstructing stones, need for different surgical procedure, need for repeat shockwave lithotripsy.   He would like to proceed with ureteroscopy.        Next Appointment:      Next Appointment: 07/15/2022 01:00 PM    Appointment Type: Surgery     Location: [facility] [phone] [id number]    Provider: Modena Slater, III, M.D.    Reason for Visit: OP WL CYSTO RT URS LL STENT      Signed by Modena Slater, III, M.D. on 07/13/22 at 10:29 AM (EDT

## 2022-07-15 NOTE — Transfer of Care (Signed)
Immediate Anesthesia Transfer of Care Note  Patient: Leonard Mercer  Procedure(s) Performed: CYSTOSCOPY RIGHT URETEROSCOPY/HOLMIUM LASER/STENT PLACEMENT (Right)  Patient Location: PACU  Anesthesia Type:General  Level of Consciousness: drowsy  Airway & Oxygen Therapy: Patient Spontanous Breathing and Patient connected to face mask oxygen  Post-op Assessment: Report given to RN and Post -op Vital signs reviewed and stable  Post vital signs: Reviewed and stable  Last Vitals:  Vitals Value Taken Time  BP 146/94 07/15/22 1420  Temp    Pulse 71 07/15/22 1421  Resp 14 07/15/22 1421  SpO2 100 % 07/15/22 1421  Vitals shown include unvalidated device data.  Last Pain:  Vitals:   07/15/22 1123  TempSrc:   PainSc: 0-No pain      Patients Stated Pain Goal: 3 (07/13/22 1138)  Complications: No notable events documented.

## 2022-07-15 NOTE — Anesthesia Procedure Notes (Signed)
Procedure Name: LMA Insertion Date/Time: 07/15/2022 12:44 PM  Performed by: Florene Route, CRNAPatient Re-evaluated:Patient Re-evaluated prior to induction Oxygen Delivery Method: Circle system utilized Preoxygenation: Pre-oxygenation with 100% oxygen Induction Type: IV induction LMA: LMA inserted LMA Size: 4.0 Number of attempts: 1 Placement Confirmation: positive ETCO2 and breath sounds checked- equal and bilateral Dental Injury: Teeth and Oropharynx as per pre-operative assessment

## 2022-07-16 ENCOUNTER — Emergency Department (HOSPITAL_COMMUNITY)
Admission: EM | Admit: 2022-07-16 | Discharge: 2022-07-16 | Disposition: A | Discharge status: Home / Self Care | Payer: BC Managed Care – PPO | Attending: Emergency Medicine | Admitting: Emergency Medicine

## 2022-07-16 ENCOUNTER — Emergency Department (HOSPITAL_COMMUNITY): Payer: BC Managed Care – PPO

## 2022-07-16 ENCOUNTER — Other Ambulatory Visit: Payer: Self-pay

## 2022-07-16 ENCOUNTER — Encounter (HOSPITAL_COMMUNITY): Payer: Self-pay

## 2022-07-16 ENCOUNTER — Emergency Department (HOSPITAL_COMMUNITY)
Admission: EM | Admit: 2022-07-16 | Discharge: 2022-07-17 | Disposition: A | Discharge status: Home / Self Care | Payer: BC Managed Care – PPO | Source: Home / Self Care | Attending: Emergency Medicine | Admitting: Emergency Medicine

## 2022-07-16 DIAGNOSIS — N179 Acute kidney failure, unspecified: Secondary | ICD-10-CM

## 2022-07-16 DIAGNOSIS — Z7984 Long term (current) use of oral hypoglycemic drugs: Secondary | ICD-10-CM | POA: Insufficient documentation

## 2022-07-16 DIAGNOSIS — R339 Retention of urine, unspecified: Secondary | ICD-10-CM | POA: Insufficient documentation

## 2022-07-16 DIAGNOSIS — Z79899 Other long term (current) drug therapy: Secondary | ICD-10-CM | POA: Insufficient documentation

## 2022-07-16 DIAGNOSIS — G20A1 Parkinson's disease without dyskinesia, without mention of fluctuations: Secondary | ICD-10-CM | POA: Insufficient documentation

## 2022-07-16 DIAGNOSIS — E119 Type 2 diabetes mellitus without complications: Secondary | ICD-10-CM | POA: Insufficient documentation

## 2022-07-16 DIAGNOSIS — I10 Essential (primary) hypertension: Secondary | ICD-10-CM | POA: Insufficient documentation

## 2022-07-16 DIAGNOSIS — R310 Gross hematuria: Secondary | ICD-10-CM | POA: Insufficient documentation

## 2022-07-16 LAB — HEMOGLOBIN A1C
Hgb A1c MFr Bld: 8.6 % — ABNORMAL HIGH (ref 4.8–5.6)
Mean Plasma Glucose: 200 mg/dL

## 2022-07-16 LAB — URINALYSIS, ROUTINE W REFLEX MICROSCOPIC
Bacteria, UA: NONE SEEN
Bilirubin Urine: NEGATIVE
Glucose, UA: 150 mg/dL — AB
Ketones, ur: NEGATIVE mg/dL
Nitrite: NEGATIVE
Protein, ur: 100 mg/dL — AB
RBC / HPF: >50 RBC/hpf (ref 0–5)
Specific Gravity, Urine: 1.014 (ref 1.005–1.030)
pH: 7 (ref 5.0–8.0)

## 2022-07-16 MED ORDER — TAMSULOSIN HCL 0.4 MG PO CAPS: 0.4000 mg | ORAL_CAPSULE | Freq: Every day | ORAL | 0 refills | Status: AC

## 2022-07-16 MED ORDER — CEPHALEXIN 500 MG PO CAPS
500.0000 mg | ORAL_CAPSULE | Freq: Once | ORAL | Status: AC
  Administered 2022-07-16: 500 mg via ORAL
  Filled 2022-07-16: qty 1

## 2022-07-16 MED ORDER — LIDOCAINE HCL URETHRAL/MUCOSAL 2 % EX GEL
1.0000 | Freq: Once | CUTANEOUS | Status: AC
  Administered 2022-07-16: 1 via URETHRAL
  Filled 2022-07-16: qty 11

## 2022-07-16 MED ORDER — CEPHALEXIN 500 MG PO CAPS: 500.0000 mg | ORAL_CAPSULE | Freq: Two times a day (BID) | ORAL | 0 refills | Status: DC

## 2022-07-16 NOTE — ED Notes (Signed)
Leg bag placed. Education given to patient and wife at bedside on home care.

## 2022-07-16 NOTE — ED Provider Notes (Signed)
[facility] EMERGENCY DEPARTMENT AT [facility] Provider Note   CSN: [id number] Arrival date & time: 07/16/22  2210     History {Add pertinent medical, surgical, social history, OB history to HPI:1} Chief Complaint  Patient presents with   Hematuria    Leonard Mercer is a 67 y.o. male.  HPI     This is a 67 year old male with a history of Parkinson's disease who presents with hematuria and dysuria.  Patient had lithotripsy and stent placement on Friday morning.  He presented to the ED approximately 24 hours ago with urinary retention.  Foley catheter was placed.  They state that they noted increase bloody urinary output.  Patient describes pins-and-needles and pain with urgency to urinate.  He is not having any back pain or fevers.  He is not leaking around the catheter.  Reports taking his antibiotics as prescribed as well as Flomax.  He took 1 dose of pain medicine this morning.  Home Medications Prior to Admission medications   Medication Sig Start Date End Date Taking? Authorizing Provider  carbidopa-levodopa (SINEMET CR) 50-200 MG tablet Take 1 tablet by mouth 3 (three) times daily. 05/31/21   Huston Foley, MD  carbidopa-levodopa (SINEMET IR) 25-100 MG tablet TAKE 1 TABLET BY MOUTH THREE TIMES DAILY 01/26/22   Ihor Austin, NP  cephALEXin (KEFLEX) 500 MG capsule Take 1 capsule (500 mg total) by mouth 2 (two) times daily. 07/16/22   Zadie Rhine, MD  escitalopram (LEXAPRO) 10 MG tablet Take 1 tablet (10 mg total) by mouth daily. Patient not taking: Reported on 07/13/2022 09/20/21   Ihor Austin, NP  escitalopram (LEXAPRO) 5 MG tablet Take 10 mg by mouth daily in the afternoon. 05/12/22   [provider]  ezetimibe (ZETIA) 10 MG tablet Take 10 mg by mouth every evening. 03/24/21   [provider]  losartan (COZAAR) 50 MG tablet Take 50 mg by mouth daily with lunch. 08/21/20   [provider]  Melatonin 10 MG TABS Take 10 mg by mouth at bedtime as  needed (sleep.).    [provider]  memantine (NAMENDA) 10 MG tablet Take 1 tablet (10 mg total) by mouth 2 (two) times daily. 09/20/21   Ihor Austin, NP  metFORMIN (GLUCOPHAGE) 1000 MG tablet Take 1,000 mg by mouth 2 (two) times daily with a meal.    [provider]  ondansetron (ZOFRAN) 4 MG tablet Take 1 tablet (4 mg total) by mouth every 8 (eight) hours as needed for nausea or vomiting. 07/10/22   Terrilee Files, MD  oxyCODONE-acetaminophen (PERCOCET/ROXICET) 5-325 MG tablet Take 1 tablet by mouth every 6 (six) hours as needed for severe pain. 07/10/22   Terrilee Files, MD  polyethylene glycol (MIRALAX / GLYCOLAX) 17 g packet Take 17 g by mouth daily as needed (constipation.).    [provider]  rosuvastatin (CRESTOR) 10 MG tablet Take 10 mg by mouth every evening. 09/02/21   [provider]  tamsulosin (FLOMAX) 0.4 MG CAPS capsule Take 1 capsule (0.4 mg total) by mouth daily after supper. 07/16/22   Zadie Rhine, MD      Allergies    Ace inhibitors, Donepezil hcl, Sulfamethoxazole-trimethoprim, and Pravastatin    Review of Systems   Review of Systems  Constitutional:  Negative for fever.  Genitourinary:  Positive for dysuria and urgency.  All other systems reviewed and are negative.   Physical Exam Updated Vital Signs BP (!) 147/110   Pulse 79   Temp 98.1  F (36.7 C) (Oral)   Resp 18   SpO2 99%  Physical Exam Vitals and nursing note reviewed.  Constitutional:      Appearance: He is well-developed.     Comments: Chronically ill-appearing, no acute distress  HENT:     Head: Normocephalic and atraumatic.  Eyes:     Pupils: Pupils are equal, round, and reactive to light.  Cardiovascular:     Rate and Rhythm: Normal rate and regular rhythm.     Heart sounds: Normal heart sounds. No murmur heard. Pulmonary:     Effort: Pulmonary effort is normal. No respiratory distress.     Breath sounds: Normal breath sounds. No wheezing.   Abdominal:     Palpations: Abdomen is soft.     Tenderness: There is no abdominal tenderness. There is no right CVA tenderness, left CVA tenderness or rebound.  Genitourinary:    Comments: Foley catheter with bloody urine noted in catheter bag Musculoskeletal:     Cervical back: Neck supple.  Lymphadenopathy:     Cervical: No cervical adenopathy.  Skin:    General: Skin is warm and dry.  Neurological:     Mental Status: He is alert and oriented to person, place, and time.     Comments: Resting tremor noted  Psychiatric:        Mood and Affect: Mood normal.     ED Results / Procedures / Treatments   Labs (all labs ordered are listed, but only abnormal results are displayed) Labs Reviewed  URINALYSIS, ROUTINE W REFLEX MICROSCOPIC  CBC WITH DIFFERENTIAL/PLATELET  BASIC METABOLIC PANEL    EKG None  Radiology DG C-Arm 1-60 Min-No Report  Result Date: 07/15/2022 Fluoroscopy was utilized by the requesting physician.  No radiographic interpretation.   DG C-Arm 1-60 Min-No Report  Result Date: 07/15/2022 Fluoroscopy was utilized by the requesting physician.  No radiographic interpretation.    Procedures Procedures  {Document cardiac monitor, telemetry assessment procedure when appropriate:1}  Medications Ordered in ED Medications - No data to display  ED Course/ Medical Decision Making/ A&P   {   Click here for ABCD2, HEART and other calculatorsREFRESH Note before signing :1}                          Medical Decision Making Amount and/or Complexity of Data Reviewed Labs: ordered. Radiology: ordered.   ***  {Document critical care time when appropriate:1} {Document review of labs and clinical decision tools ie heart score, Chads2Vasc2 etc:1}  {Document your independent review of radiology images, and any outside records:1} {Document your discussion with family members, caretakers, and with consultants:1} {Document social determinants of health affecting pt's  care:1} {Document your decision making why or why not admission, treatments were needed:1} Final Clinical Impression(s) / ED Diagnoses Final diagnoses:  None    Rx / DC Orders ED Discharge Orders     None

## 2022-07-16 NOTE — ED Triage Notes (Signed)
Pt states that he feels like he has to push to urinate and reports having more blood than normal in his urine bag.

## 2022-07-16 NOTE — ED Triage Notes (Addendum)
Pt reports having kidney stone removal surgery today 5/31 and has not urinated since except a few drops of blood.

## 2022-07-16 NOTE — ED Provider Notes (Signed)
EMERGENCY DEPARTMENT AT [facility] Provider Note   CSN: [id number] Arrival date & time: 07/16/22  4098     History  Chief Complaint  Patient presents with   Urinary Retention    Leonard Mercer is a 67 y.o. male.  The history is provided by the patient.   Patient presents with urinary retention.  He just underwent cystoscopy for ureteral stone on May 31.  After going home from the surgery, he had difficulty urinating  He reports he is only had a few drops of blood passed.  He is having significant pain in his lower abdomen.  No fevers or vomiting.  He is not on anticoagulation  Home Medications Prior to Admission medications   Medication Sig Start Date End Date Taking? Authorizing Provider  cephALEXin (KEFLEX) 500 MG capsule Take 1 capsule (500 mg total) by mouth 2 (two) times daily. 07/16/22  Yes Zadie Rhine, MD  tamsulosin (FLOMAX) 0.4 MG CAPS capsule Take 1 capsule (0.4 mg total) by mouth daily after supper. 07/16/22  Yes Zadie Rhine, MD  carbidopa-levodopa (SINEMET CR) 50-200 MG tablet Take 1 tablet by mouth 3 (three) times daily. 05/31/21   Huston Foley, MD  carbidopa-levodopa (SINEMET IR) 25-100 MG tablet TAKE 1 TABLET BY MOUTH THREE TIMES DAILY 01/26/22   Ihor Austin, NP  escitalopram (LEXAPRO) 10 MG tablet Take 1 tablet (10 mg total) by mouth daily. Patient not taking: Reported on 07/13/2022 09/20/21   Ihor Austin, NP  escitalopram (LEXAPRO) 5 MG tablet Take 10 mg by mouth daily in the afternoon. 05/12/22   [provider]  ezetimibe (ZETIA) 10 MG tablet Take 10 mg by mouth every evening. 03/24/21   [provider]  losartan (COZAAR) 50 MG tablet Take 50 mg by mouth daily with lunch. 08/21/20   [provider]  Melatonin 10 MG TABS Take 10 mg by mouth at bedtime as needed (sleep.).    [provider]  memantine (NAMENDA) 10 MG tablet Take 1 tablet (10 mg total) by mouth 2 (two) times daily. 09/20/21   Ihor Austin,  NP  metFORMIN (GLUCOPHAGE) 1000 MG tablet Take 1,000 mg by mouth 2 (two) times daily with a meal.    [provider]  ondansetron (ZOFRAN) 4 MG tablet Take 1 tablet (4 mg total) by mouth every 8 (eight) hours as needed for nausea or vomiting. 07/10/22   Terrilee Files, MD  oxyCODONE-acetaminophen (PERCOCET/ROXICET) 5-325 MG tablet Take 1 tablet by mouth every 6 (six) hours as needed for severe pain. 07/10/22   Terrilee Files, MD  polyethylene glycol (MIRALAX / GLYCOLAX) 17 g packet Take 17 g by mouth daily as needed (constipation.).    [provider]  rosuvastatin (CRESTOR) 10 MG tablet Take 10 mg by mouth every evening. 09/02/21   [provider]      Allergies    Ace inhibitors, Donepezil hcl, Sulfamethoxazole-trimethoprim, and Pravastatin    Review of Systems   Review of Systems  Constitutional:  Negative for fever.  Gastrointestinal:  Negative for vomiting.  Genitourinary:  Positive for difficulty urinating.    Physical Exam Updated Vital Signs BP (!) 130/93   Pulse 66   Temp 98.4 F (36.9 C) (Oral)   Resp 17   SpO2 96%  Physical Exam CONSTITUTIONAL: Elderly, uncomfortable appearing HEAD: Normocephalic/atraumatic ABDOMEN: soft, suprapubic tenderness/fullness noted GU: Blood noted in his underwear.  Otherwise normal external genitalia Nurse tech chaperone present for exam NEURO: Pt is awake/alert/appropriate, moves all  extremitiesx4.  No facial droop.  Pill-rolling tremor noted EXTREMITIES: full ROM SKIN: warm, color normal   ED Results / Procedures / Treatments   Labs (all labs ordered are listed, but only abnormal results are displayed) Labs Reviewed  URINALYSIS, ROUTINE W REFLEX MICROSCOPIC - Abnormal; Notable for the following components:      Result Value   APPearance HAZY (*)    Glucose, UA 150 (*)    Hgb urine dipstick LARGE (*)    Protein, ur 100 (*)    Leukocytes,Ua TRACE (*)    All other components within normal limits     EKG None  Radiology DG C-Arm 1-60 Min-No Report  Result Date: 07/15/2022 Fluoroscopy was utilized by the requesting physician.  No radiographic interpretation.   DG C-Arm 1-60 Min-No Report  Result Date: 07/15/2022 Fluoroscopy was utilized by the requesting physician.  No radiographic interpretation.    Procedures Procedures    Medications Ordered in ED Medications  lidocaine (XYLOCAINE) 2 % jelly 1 Application (has no administration in time range)  cephALEXin (KEFLEX) capsule 500 mg (500 mg Oral Given 07/16/22 0214)    ED Course/ Medical Decision Making/ A&P Clinical Course as of 07/16/22 0217  Sat Jul 16, 2022  0101 Patient with recent cystoscopy for ureteral stone.  He had stent placement.  Patient is now having acute urinary retention.  Discussed the case with on-call Dr. Laverle Patter with urology.  He has reviewed the case.  He requests to place a Foley for the urinary retention.  He also request that patient be placed on Flomax [DW]  0140 Patient feeling improved after Foley catheter placement. [DW]  0216 Pt improved He has transitioned to leg bag Due to recent instrumentation, will start oral keflex Flomax ordered at urology request Pt stable for d/c home [DW]    Clinical Course User Index [DW] Zadie Rhine, MD                             Medical Decision Making Amount and/or Complexity of Data Reviewed Labs: ordered.  Risk Prescription drug management.           Final Clinical Impression(s) / ED Diagnoses Final diagnoses:  Urinary retention    Rx / DC Orders ED Discharge Orders          Ordered    tamsulosin (FLOMAX) 0.4 MG CAPS capsule  Daily after supper        07/16/22 0140    cephALEXin (KEFLEX) 500 MG capsule  2 times daily        07/16/22 1610              Zadie Rhine, MD 07/16/22 [phone]

## 2022-07-17 LAB — URINALYSIS, ROUTINE W REFLEX MICROSCOPIC
Bilirubin Urine: NEGATIVE
Glucose, UA: 50 mg/dL — AB
Ketones, ur: NEGATIVE mg/dL
Nitrite: NEGATIVE
Protein, ur: 100 mg/dL — AB
RBC / HPF: >50 RBC/hpf (ref 0–5)
Specific Gravity, Urine: 1.017 (ref 1.005–1.030)
pH: 5 (ref 5.0–8.0)

## 2022-07-17 LAB — CBC WITH DIFFERENTIAL/PLATELET
Abs Immature Granulocytes: 0.03 10*3/uL (ref 0.00–0.07)
Basophils Absolute: 0 10*3/uL (ref 0.0–0.1)
Basophils Relative: 1 %
Eosinophils Absolute: 0.1 10*3/uL (ref 0.0–0.5)
Eosinophils Relative: 1 %
HCT: 44.3 % (ref 39.0–52.0)
Hemoglobin: 14.5 g/dL (ref 13.0–17.0)
Immature Granulocytes: 0 %
Lymphocytes Relative: 29 %
Lymphs Abs: 2.2 10*3/uL (ref 0.7–4.0)
MCH: 30.5 pg (ref 26.0–34.0)
MCHC: 32.7 g/dL (ref 30.0–36.0)
MCV: 93.3 fL (ref 80.0–100.0)
Monocytes Absolute: 0.6 10*3/uL (ref 0.1–1.0)
Monocytes Relative: 8 %
Neutro Abs: 4.7 10*3/uL (ref 1.7–7.7)
Neutrophils Relative %: 61 %
Platelets: 191 10*3/uL (ref 150–400)
RBC: 4.75 MIL/uL (ref 4.22–5.81)
RDW: 12.3 % (ref 11.5–15.5)
WBC: 7.7 10*3/uL (ref 4.0–10.5)
nRBC: 0 % (ref 0.0–0.2)

## 2022-07-17 LAB — BASIC METABOLIC PANEL
Anion gap: 10 (ref 5–15)
BUN: 36 mg/dL — ABNORMAL HIGH (ref 8–23)
CO2: 26 mmol/L (ref 22–32)
Calcium: 9 mg/dL (ref 8.9–10.3)
Chloride: 105 mmol/L (ref 98–111)
Creatinine, Ser: 1.27 mg/dL — ABNORMAL HIGH (ref 0.61–1.24)
GFR, Estimated: >60 mL/min (ref >60)
Glucose, Bld: 125 mg/dL — ABNORMAL HIGH (ref 70–99)
Potassium: 4.7 mmol/L (ref 3.5–5.1)
Sodium: 141 mmol/L (ref 135–145)

## 2022-07-17 MED ORDER — SODIUM CHLORIDE 0.9 % IV BOLUS
1000.0000 mL | Freq: Once | INTRAVENOUS | Status: AC
  Administered 2022-07-17: 1000 mL via INTRAVENOUS

## 2022-07-17 MED ORDER — PHENAZOPYRIDINE HCL 200 MG PO TABS: 200.0000 mg | ORAL_TABLET | Freq: Three times a day (TID) | ORAL | 0 refills | Status: DC | PRN

## 2022-07-17 MED ORDER — PHENAZOPYRIDINE HCL 100 MG PO TABS
100.0000 mg | ORAL_TABLET | Freq: Three times a day (TID) | ORAL | Status: DC
  Administered 2022-07-17: 100 mg via ORAL
  Filled 2022-07-17: qty 1

## 2022-07-17 NOTE — Discharge Instructions (Signed)
You were seen today for bloody urine and concern for pain with urination.  Monitor your urinary output closely.  Make sure that you are staying well-hydrated.  Your kidney function was elevated slightly and this needs to be rechecked by your urologist.  Take Pyridium as needed for bladder spasm and continue your other medications as prescribed.

## 2022-07-25 ENCOUNTER — Encounter: Payer: Self-pay | Admitting: Neurology

## 2022-07-25 ENCOUNTER — Ambulatory Visit: Payer: BC Managed Care – PPO | Admitting: Neurology

## 2022-07-25 VITALS — BP 132/87 | HR 83

## 2022-07-25 DIAGNOSIS — R413 Other amnesia: Secondary | ICD-10-CM

## 2022-07-25 DIAGNOSIS — G20A1 Parkinson's disease without dyskinesia, without mention of fluctuations: Secondary | ICD-10-CM | POA: Diagnosis not present

## 2022-07-25 DIAGNOSIS — F439 Reaction to severe stress, unspecified: Secondary | ICD-10-CM | POA: Diagnosis not present

## 2022-07-25 DIAGNOSIS — R4589 Other symptoms and signs involving emotional state: Secondary | ICD-10-CM | POA: Diagnosis not present

## 2022-07-25 MED ORDER — CARBIDOPA-LEVODOPA 25-100 MG PO TABS: 1.0000 | ORAL_TABLET | Freq: Four times a day (QID) | ORAL | 3 refills | Status: DC

## 2022-07-25 MED ORDER — ESCITALOPRAM OXALATE 10 MG PO TABS: 10.0000 mg | ORAL_TABLET | Freq: Every day | ORAL | 3 refills | Status: DC

## 2022-07-25 MED ORDER — CARBIDOPA-LEVODOPA ER 50-200 MG PO TBCR: 1.0000 | EXTENDED_RELEASE_TABLET | Freq: Three times a day (TID) | ORAL | 3 refills | Status: DC

## 2022-07-25 NOTE — Patient Instructions (Signed)
It was nice to see you again today.  I am sorry you have had recent stressors.  I hope things will settle down. We will continue with Sinemet long-acting 1 pill 3 times a day at 10 AM, 3:30 PM and 9 PM daily. We will increase your Sinemet immediate release to 1 pill 4 times a day, at 10 AM, 2 PM, 6 PM and 10 PM daily. We will continue with memantine 10 mg twice daily for memory loss. We will continue with generic Lexapro 10 mg once daily for depression. Please follow-up in about 4 to 6 months to see Shanda Bumps.
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
## 2022-08-01 ENCOUNTER — Emergency Department (HOSPITAL_COMMUNITY): Payer: BC Managed Care – PPO

## 2022-08-01 ENCOUNTER — Inpatient Hospital Stay (HOSPITAL_COMMUNITY)
Admission: EM | Admit: 2022-08-01 | Discharge: 2022-08-06 | DRG: 698 | Disposition: A | Discharge status: Home / Self Care | Payer: BC Managed Care – PPO | Attending: Family Medicine | Admitting: Family Medicine

## 2022-08-01 ENCOUNTER — Encounter (HOSPITAL_COMMUNITY): Payer: Self-pay | Admitting: Emergency Medicine

## 2022-08-01 ENCOUNTER — Other Ambulatory Visit: Payer: Self-pay

## 2022-08-01 DIAGNOSIS — Z87442 Personal history of urinary calculi: Secondary | ICD-10-CM

## 2022-08-01 DIAGNOSIS — R112 Nausea with vomiting, unspecified: Secondary | ICD-10-CM | POA: Insufficient documentation

## 2022-08-01 DIAGNOSIS — A419 Sepsis, unspecified organism: Principal | ICD-10-CM

## 2022-08-01 DIAGNOSIS — T83192A Other mechanical complication of urinary stent, initial encounter: Secondary | ICD-10-CM

## 2022-08-01 DIAGNOSIS — G20A1 Parkinson's disease without dyskinesia, without mention of fluctuations: Secondary | ICD-10-CM | POA: Diagnosis present

## 2022-08-01 DIAGNOSIS — A4152 Sepsis due to Pseudomonas: Secondary | ICD-10-CM | POA: Diagnosis present

## 2022-08-01 DIAGNOSIS — E872 Acidosis, unspecified: Secondary | ICD-10-CM | POA: Diagnosis present

## 2022-08-01 DIAGNOSIS — T83511A Infection and inflammatory reaction due to indwelling urethral catheter, initial encounter: Principal | ICD-10-CM | POA: Diagnosis present

## 2022-08-01 DIAGNOSIS — Z79899 Other long term (current) drug therapy: Secondary | ICD-10-CM

## 2022-08-01 DIAGNOSIS — Z8249 Family history of ischemic heart disease and other diseases of the circulatory system: Secondary | ICD-10-CM

## 2022-08-01 DIAGNOSIS — E782 Mixed hyperlipidemia: Secondary | ICD-10-CM | POA: Diagnosis present

## 2022-08-01 DIAGNOSIS — F32A Depression, unspecified: Secondary | ICD-10-CM | POA: Diagnosis present

## 2022-08-01 DIAGNOSIS — Z882 Allergy status to sulfonamides status: Secondary | ICD-10-CM

## 2022-08-01 DIAGNOSIS — Z8616 Personal history of COVID-19: Secondary | ICD-10-CM

## 2022-08-01 DIAGNOSIS — Z888 Allergy status to other drugs, medicaments and biological substances status: Secondary | ICD-10-CM

## 2022-08-01 DIAGNOSIS — E860 Dehydration: Secondary | ICD-10-CM | POA: Diagnosis present

## 2022-08-01 DIAGNOSIS — N400 Enlarged prostate without lower urinary tract symptoms: Secondary | ICD-10-CM | POA: Diagnosis present

## 2022-08-01 DIAGNOSIS — E1165 Type 2 diabetes mellitus with hyperglycemia: Secondary | ICD-10-CM | POA: Diagnosis present

## 2022-08-01 DIAGNOSIS — R6520 Severe sepsis without septic shock: Secondary | ICD-10-CM | POA: Diagnosis present

## 2022-08-01 DIAGNOSIS — Z833 Family history of diabetes mellitus: Secondary | ICD-10-CM

## 2022-08-01 DIAGNOSIS — N390 Urinary tract infection, site not specified: Secondary | ICD-10-CM

## 2022-08-01 DIAGNOSIS — Y846 Urinary catheterization as the cause of abnormal reaction of the patient, or of later complication, without mention of misadventure at the time of the procedure: Secondary | ICD-10-CM | POA: Diagnosis present

## 2022-08-01 DIAGNOSIS — N136 Pyonephrosis: Secondary | ICD-10-CM | POA: Diagnosis present

## 2022-08-01 DIAGNOSIS — R109 Unspecified abdominal pain: Secondary | ICD-10-CM | POA: Insufficient documentation

## 2022-08-01 DIAGNOSIS — I10 Essential (primary) hypertension: Secondary | ICD-10-CM | POA: Diagnosis present

## 2022-08-01 DIAGNOSIS — Z7984 Long term (current) use of oral hypoglycemic drugs: Secondary | ICD-10-CM

## 2022-08-01 LAB — URINALYSIS, ROUTINE W REFLEX MICROSCOPIC
Bilirubin Urine: NEGATIVE
Glucose, UA: NEGATIVE mg/dL
Ketones, ur: 5 mg/dL — AB
Nitrite: POSITIVE — AB
Protein, ur: 100 mg/dL — AB
RBC / HPF: >50 RBC/hpf (ref 0–5)
Specific Gravity, Urine: 1.015 (ref 1.005–1.030)
WBC, UA: >50 WBC/hpf (ref 0–5)
pH: 5 (ref 5.0–8.0)

## 2022-08-01 LAB — CBC
HCT: 42.3 % (ref 39.0–52.0)
Hemoglobin: 13.9 g/dL (ref 13.0–17.0)
MCH: 30.5 pg (ref 26.0–34.0)
MCHC: 32.9 g/dL (ref 30.0–36.0)
MCV: 92.8 fL (ref 80.0–100.0)
Platelets: 226 10*3/uL (ref 150–400)
RBC: 4.56 MIL/uL (ref 4.22–5.81)
RDW: 12.4 % (ref 11.5–15.5)
WBC: 21 10*3/uL — ABNORMAL HIGH (ref 4.0–10.5)
nRBC: 0 % (ref 0.0–0.2)

## 2022-08-01 LAB — LACTIC ACID, PLASMA
Lactic Acid, Venous: 2.2 mmol/L (ref 0.5–1.9)
Lactic Acid, Venous: 1.7 mmol/L (ref 0.5–1.9)

## 2022-08-01 LAB — BASIC METABOLIC PANEL
Anion gap: 12 (ref 5–15)
BUN: 30 mg/dL — ABNORMAL HIGH (ref 8–23)
CO2: 25 mmol/L (ref 22–32)
Calcium: 8.6 mg/dL — ABNORMAL LOW (ref 8.9–10.3)
Chloride: 99 mmol/L (ref 98–111)
Creatinine, Ser: 1.15 mg/dL (ref 0.61–1.24)
GFR, Estimated: >60 mL/min (ref >60)
Glucose, Bld: 187 mg/dL — ABNORMAL HIGH (ref 70–99)
Potassium: 3.9 mmol/L (ref 3.5–5.1)
Sodium: 136 mmol/L (ref 135–145)

## 2022-08-01 MED ORDER — SODIUM CHLORIDE 0.9 % IV BOLUS
1000.0000 mL | Freq: Once | INTRAVENOUS | Status: AC
  Administered 2022-08-01: 1000 mL via INTRAVENOUS

## 2022-08-01 MED ORDER — SODIUM CHLORIDE 0.9 % IV SOLN
2.0000 g | Freq: Three times a day (TID) | INTRAVENOUS | Status: DC
  Administered 2022-08-02 – 2022-08-05 (×10): 2 g via INTRAVENOUS
  Filled 2022-08-01 (×10): qty 12.5

## 2022-08-01 MED ORDER — LACTATED RINGERS IV SOLN: INTRAVENOUS | Status: DC

## 2022-08-01 MED ORDER — MORPHINE SULFATE (PF) 4 MG/ML IV SOLN
4.0000 mg | Freq: Once | INTRAVENOUS | Status: AC
  Administered 2022-08-01: 4 mg via INTRAVENOUS
  Filled 2022-08-01: qty 1

## 2022-08-01 MED ORDER — SODIUM CHLORIDE 0.9 % IV SOLN: 2.0000 g | Freq: Once | INTRAVENOUS | Status: DC

## 2022-08-01 MED ORDER — ONDANSETRON HCL 4 MG/2ML IJ SOLN
4.0000 mg | Freq: Once | INTRAMUSCULAR | Status: AC
  Administered 2022-08-01: 4 mg via INTRAVENOUS
  Filled 2022-08-01: qty 2

## 2022-08-01 MED ORDER — SODIUM CHLORIDE 0.9 % IV SOLN
2.0000 g | Freq: Once | INTRAVENOUS | Status: AC
  Administered 2022-08-01: 2 g via INTRAVENOUS
  Filled 2022-08-01: qty 12.5

## 2022-08-01 MED ORDER — ONDANSETRON HCL 4 MG/2ML IJ SOLN: 4.0000 mg | Freq: Once | INTRAMUSCULAR | Status: DC | PRN

## 2022-08-01 NOTE — Progress Notes (Signed)
Elink monitoring for the code sepsis protocol.  

## 2022-08-01 NOTE — ED Notes (Signed)
Pt taken to CT.

## 2022-08-01 NOTE — Progress Notes (Signed)
Pharmacy Antibiotic Note  Leonard Mercer is a 67 y.o. male admitted on 08/01/2022 with N/V, fever, bilateral flank pain. Pt is s/p cystoscopy with stent placement on 5/31. Pharmacy has been consulted for cefepime dosing for UTI.  Today, 08/01/22 WBC elevated  Plan: Cefepime 2g IV q8h Monitor renal function, culture data  Height: 5\' 11"  (180.3 cm) Weight: 72.6 kg (160 lb) IBW/kg (Calculated) : 75.3  Temp (24hrs), Avg:99.6 F (37.6 C), Min:99.6 F (37.6 C), Max:99.6 F (37.6 C)  Recent Labs  Lab 08/01/22 1844 08/01/22 1944  WBC 21.0*  --   CREATININE 1.15  --   LATICACIDVEN 2.2* 1.7    Estimated Creatinine Clearance: 66.6 mL/min (by C-G formula based on SCr of 1.15 mg/dL).    Allergies  Allergen Reactions   Ace Inhibitors Other (See Comments)    unknown   Donepezil Hcl Other (See Comments)    Stomach upset Stomach upset   Sulfamethoxazole-Trimethoprim Nausea And Vomiting   Pravastatin Rash    Antimicrobials this admission: Cefepime 6/17 >>  Dose adjustments this admission:  Microbiology results: 6/17 BCx: Sent  Cindi Carbon, PharmD 08/01/2022 9:22 PM

## 2022-08-01 NOTE — ED Notes (Signed)
Date and time results received: 08/01/22 8:15 PM  Test: Lactic acid Critical Value: 2.2  Name of Provider Notified: Edwin Dada  Awaiting orders

## 2022-08-01 NOTE — ED Notes (Signed)
Bladder scanner did not show significant amount of urine. Unable to record exact amount due to movement of patients' abdomen.

## 2022-08-01 NOTE — ED Triage Notes (Addendum)
Pt via POV after renal stent placement 2 weeks ago after lithotripsy procedure. Pt c/o fever, n/v, bilateral flank pain 7/10, decreased urine output in indwelling catheter. Pt is actively vomiting in triage.

## 2022-08-01 NOTE — ED Provider Notes (Signed)
[place] EMERGENCY DEPARTMENT AT [facility] Provider Note   CSN: [id number] Arrival date & time: 08/01/22  1733     History {Add pertinent medical, surgical, social history, OB history to HPI:1} Chief Complaint  Patient presents with   Post-op Problem    Leonard Mercer is a 67 y.o. male.  Patient is a 67 year old male with past medical history of urethral lithiasis with lithotripsy completed on Jul 15, 2022 with stent placement and Foley catheter placement.  Patient still has Foley catheter in place.  Began having fevers, chills, nausea, vomiting, and suprapubic abdominal pain.  The history is provided by the patient. No language interpreter was used.       Home Medications Prior to Admission medications   Medication Sig Start Date End Date Taking? Authorizing Provider  carbidopa-levodopa (SINEMET CR) 50-200 MG tablet Take 1 tablet by mouth 3 (three) times daily. 07/25/22   Huston Foley, MD  carbidopa-levodopa (SINEMET IR) 25-100 MG tablet Take 1 tablet by mouth 4 (four) times daily. 07/25/22   Huston Foley, MD  cephALEXin (KEFLEX) 500 MG capsule Take 1 capsule (500 mg total) by mouth 2 (two) times daily. 07/16/22   Zadie Rhine, MD  escitalopram (LEXAPRO) 10 MG tablet Take 1 tablet (10 mg total) by mouth daily. 07/25/22   Huston Foley, MD  ezetimibe (ZETIA) 10 MG tablet Take 10 mg by mouth every evening. 03/24/21   [provider]  losartan (COZAAR) 50 MG tablet Take 50 mg by mouth daily with lunch. 08/21/20   [provider]  Melatonin 10 MG TABS Take 10 mg by mouth at bedtime as needed (sleep.).    [provider]  memantine (NAMENDA) 10 MG tablet Take 1 tablet (10 mg total) by mouth 2 (two) times daily. 09/20/21   Ihor Austin, NP  metFORMIN (GLUCOPHAGE) 1000 MG tablet Take 1,000 mg by mouth 2 (two) times daily with a meal.    [provider]  ondansetron (ZOFRAN) 4 MG tablet Take 1 tablet (4 mg total) by mouth every 8 (eight) hours  as needed for nausea or vomiting. Patient taking differently: Take 4 mg by mouth as needed for nausea or vomiting. 07/10/22   Terrilee Files, MD  oxyCODONE-acetaminophen (PERCOCET/ROXICET) 5-325 MG tablet Take 1 tablet by mouth every 6 (six) hours as needed for severe pain. 07/10/22   Terrilee Files, MD  phenazopyridine (PYRIDIUM) 200 MG tablet Take 1 tablet (200 mg total) by mouth 3 (three) times daily as needed for pain. 07/17/22   Horton, Mayer Masker, MD  polyethylene glycol (MIRALAX / GLYCOLAX) 17 g packet Take 17 g by mouth daily as needed (constipation.).    [provider]  rosuvastatin (CRESTOR) 10 MG tablet Take 10 mg by mouth every evening. 09/02/21   [provider]  tamsulosin (FLOMAX) 0.4 MG CAPS capsule Take 1 capsule (0.4 mg total) by mouth daily after supper. 07/16/22   Zadie Rhine, MD      Allergies    Ace inhibitors, Donepezil hcl, Sulfamethoxazole-trimethoprim, and Pravastatin    Review of Systems   Review of Systems  Constitutional:  Positive for chills and fever.  HENT:  Negative for ear pain and sore throat.   Eyes:  Negative for pain and visual disturbance.  Respiratory:  Negative for cough and shortness of breath.   Cardiovascular:  Negative for chest pain and palpitations.  Gastrointestinal:  Positive for abdominal pain, nausea and vomiting.  Genitourinary:  Negative for dysuria and hematuria.  Musculoskeletal:  Negative for arthralgias and back pain.  Skin:  Negative for color change and rash.  Neurological:  Negative for seizures and syncope.  All other systems reviewed and are negative.   Physical Exam Updated Vital Signs BP 136/87 (BP Location: Right Arm)   Pulse 88   Temp 98.9 F (37.2 C)   Resp (!) 22   Ht 5\' 11"  (1.803 m)   Wt 72.6 kg   SpO2 99%   BMI 22.32 kg/m  Physical Exam Vitals and nursing note reviewed.  Constitutional:      General: He is not in acute distress.    Appearance: He is well-developed. He is  ill-appearing.  HENT:     Head: Normocephalic and atraumatic.  Eyes:     Conjunctiva/sclera: Conjunctivae normal.  Cardiovascular:     Rate and Rhythm: Normal rate and regular rhythm.     Heart sounds: No murmur heard. Pulmonary:     Effort: Pulmonary effort is normal. No respiratory distress.     Breath sounds: Normal breath sounds.  Abdominal:     Palpations: Abdomen is soft.     Tenderness: There is no abdominal tenderness.  Musculoskeletal:        General: No swelling.     Cervical back: Neck supple.  Skin:    General: Skin is warm and dry.     Capillary Refill: Capillary refill takes less than 2 seconds.  Neurological:     Mental Status: He is alert.  Psychiatric:        Mood and Affect: Mood normal.     ED Results / Procedures / Treatments   Labs (all labs ordered are listed, but only abnormal results are displayed) Labs Reviewed  URINALYSIS, ROUTINE W REFLEX MICROSCOPIC - Abnormal; Notable for the following components:      Result Value   Color, Urine AMBER (*)    APPearance TURBID (*)    Hgb urine dipstick LARGE (*)    Ketones, ur 5 (*)    Protein, ur 100 (*)    Nitrite POSITIVE (*)    Leukocytes,Ua LARGE (*)    Bacteria, UA MANY (*)    All other components within normal limits  BASIC METABOLIC PANEL - Abnormal; Notable for the following components:   Glucose, Bld 187 (*)    BUN 30 (*)    Calcium 8.6 (*)    All other components within normal limits  CBC - Abnormal; Notable for the following components:   WBC 21.0 (*)    All other components within normal limits  LACTIC ACID, PLASMA - Abnormal; Notable for the following components:   Lactic Acid, Venous 2.2 (*)    All other components within normal limits  CULTURE, BLOOD (SINGLE)  LACTIC ACID, PLASMA    EKG None  Radiology CT Renal Stone Study  Result Date: 08/01/2022 CLINICAL DATA:  History of urolithiasis, with lithotripsy and stent placement last month, Foley catheter and right stenting still  in place. Presents with suspected sepsis, with abdominal pain, fever, and vomiting as well as decreased urine output. EXAM: CT ABDOMEN AND PELVIS WITHOUT CONTRAST TECHNIQUE: Multidetector CT imaging of the abdomen and pelvis was performed following the standard protocol without IV contrast. RADIATION DOSE REDUCTION: This exam was performed according to the departmental dose-optimization program which includes automated exposure control, adjustment of the mA and/or kV according to patient size and/or use of iterative reconstruction technique. COMPARISON:  CT without contrast 07/10/2022, CT with contrast 08/08/2020. FINDINGS: Lower chest: Scattered linear scar-like opacities  in the lung bases. No acute process. Small hiatal hernia. The cardiac size is normal. Calcification in the right coronary artery again is noted. No pericardial fluid. Hepatobiliary: The liver is mildly steatotic with homogeneous attenuation. No masses seen without contrast. The gallbladder and bile ducts are unremarkable. Pancreas: Some images may suggest trace stranding along side the pancreatic neck and body but this is not confirmed in all imaging planes. Correlate clinically with serum lipase for possible pancreatitis. There is fatty infiltration of the pancreas as seen previously. No masses seen without contrast. Spleen: Unremarkable without contrast.  No splenomegaly. Adrenals/Urinary Tract: There is no adrenal mass. There previously was mild right hydroureteronephrosis above a 7 mm stone at L4-5. There has been interval right ureteral stenting with the upper loop in the renal pelvis and the lower loop in the bladder superiorly in the midline. The right ureter and collecting system have decompressed in the interval. At the L5 level there are 2 adjacent stones in the ureter posterior to the stent, 1 measuring 2 mm and the other measuring 3 mm, on 2:42. No other ureteral stones are seen. No stone debris is visible in the contracted bladder.  The bladder wall is contracted and not well evaluated. The bladder is catheterized. Asymmetric right perinephric stranding edema is still seen and could relate to obstructive uropathy or inflammatory process but is unchanged since the last CT. There is trace right periureteric retroperitoneal fluid and stranding. Again noted are nonobstructive caliceal stones in the left lower pole up to 9 mm, with occasional punctate caliceal stones in the upper pole. No intrarenal stone is seen on the right. Stomach/Bowel: No bowel obstruction or inflammatory change. Normal appendix. Mild-to-moderate retained stool transverse and descending colon. Uncomplicated sigmoid diverticulosis. Vascular/Lymphatic: Aortic atherosclerosis. No enlarged abdominal or pelvic lymph nodes. Reproductive: Prostatomegaly, transverse prostate axis 4.8 cm. Other: Small inguinal fat hernias. As above trace right retroperitoneal fluid and periureteric stranding. No pelvic ascites. No free air, abscess or free hemorrhage. Musculoskeletal: Moderate to severe levoscoliosis apex L1-2. Degenerative changes lumbar spine and osteopenia. No spinal compression fractures or aggressive lesions. IMPRESSION: 1. Interval right ureteral stenting with 2 adjacent stones in the ureter at the L5 level, one measuring 2 mm and the other 3 mm. 2. Interval decompression of the right ureter and collecting system. 3. Asymmetric right perinephric as well as periureteral stranding edema is still seen and could relate to the now resolved obstructive uropathy or inflammatory process, but is unchanged since the last CT. 4. Nonobstructive left nephrolithiasis. 5. Constipation and diverticulosis. 6. Prostatomegaly. 7. Aortic and coronary artery atherosclerosis. 8. Query trace stranding at the pancreatic neck and body. Correlate clinically for pancreatitis. Aortic Atherosclerosis (ICD10-I70.0). Electronically Signed   By: Almira Bar M.D.   On: 08/01/2022 23:13     Procedures Procedures  {Document cardiac monitor, telemetry assessment procedure when appropriate:1}  Medications Ordered in ED Medications  ondansetron (ZOFRAN) injection 4 mg (has no administration in time range)  lactated ringers infusion (has no administration in time range)  ceFEPIme (MAXIPIME) 2 g in sodium chloride 0.9 % 100 mL IVPB (has no administration in time range)  sodium chloride 0.9 % bolus 1,000 mL (1,000 mLs Intravenous New Bag/Given 08/01/22 2152)  ceFEPIme (MAXIPIME) 2 g in sodium chloride 0.9 % 100 mL IVPB (0 g Intravenous Stopped 08/01/22 2309)  ondansetron (ZOFRAN) injection 4 mg (4 mg Intravenous Given 08/01/22 2153)  morphine (PF) 4 MG/ML injection 4 mg (4 mg Intravenous Given 08/01/22 2153)  ED Course/ Medical Decision Making/ A&P   {   Click here for ABCD2, HEART and other calculatorsREFRESH Note before signing :1}                          Medical Decision Making Amount and/or Complexity of Data Reviewed Labs: ordered. Radiology: ordered.  Risk Prescription drug management.    67 year old male with past medical history of urethral lithiasis with lithotripsy completed on Jul 15, 2022 with stent placement and Foley catheter placement.  Patient still has Foley catheter in place.  Began having fevers, chills, nausea, vomiting, and suprapubic abdominal pain.  I recommend patient be pulled from waiting room to soonest bed available due to laboratory studies concerning for sepsis.  Patient has leukocytosis of 21.  Lactic acidosis of 2.2.  Respiratory rate of 22.  Concern for urinary tract infection from indwelling Foley catheter and recent lithotripsy with stent placement.  Family states patient saw Dr. Alvester Morin with [facility].  CT pending.  Patient treated with cefepime.  IV fluids, Zofran, morphine given for symptomatic management.  CT demonstrates: 1. Interval right ureteral stenting with 2 adjacent stones in the ureter at the L5 level, one measuring 2 mm  and the other 3 mm. 2. Interval decompression of the right ureter and collecting system. 3. Asymmetric right perinephric as well as periureteral stranding edema is still seen and could relate to the now resolved obstructive uropathy or inflammatory process, but is unchanged since the last CT. 4. Nonobstructive left nephrolithiasis. 5. Constipation and diverticulosis. 6. Prostatomegaly. 7. Aortic and coronary artery atherosclerosis. 8. Query trace stranding at the pancreatic neck and body. Correlate clinically for pancreatitis.  UA positive for large blood nitrates, large leukocytes, many bacteria, and >50 WBCs.Stable creatinine.  Consult to urology placed.   {Document critical care time when appropriate:1} {Document review of labs and clinical decision tools ie heart score, Chads2Vasc2 etc:1}  {Document your independent review of radiology images, and any outside records:1} {Document your discussion with family members, caretakers, and with consultants:1} {Document social determinants of health affecting pt's care:1} {Document your decision making why or why not admission, treatments were needed:1} Final Clinical Impression(s) / ED Diagnoses Final diagnoses:  Sepsis, due to unspecified organism, unspecified whether acute organ dysfunction present [facility])  Urinary tract infection with hematuria, site unspecified  Occlusion of ureteral stent, initial encounter (HCC)    Rx / DC Orders ED Discharge Orders     None

## 2022-08-02 DIAGNOSIS — Z79899 Other long term (current) drug therapy: Secondary | ICD-10-CM | POA: Diagnosis not present

## 2022-08-02 DIAGNOSIS — E872 Acidosis, unspecified: Secondary | ICD-10-CM

## 2022-08-02 DIAGNOSIS — R112 Nausea with vomiting, unspecified: Secondary | ICD-10-CM | POA: Insufficient documentation

## 2022-08-02 DIAGNOSIS — E860 Dehydration: Secondary | ICD-10-CM | POA: Insufficient documentation

## 2022-08-02 DIAGNOSIS — A419 Sepsis, unspecified organism: Secondary | ICD-10-CM | POA: Diagnosis present

## 2022-08-02 DIAGNOSIS — R109 Unspecified abdominal pain: Secondary | ICD-10-CM | POA: Insufficient documentation

## 2022-08-02 DIAGNOSIS — Z882 Allergy status to sulfonamides status: Secondary | ICD-10-CM | POA: Diagnosis not present

## 2022-08-02 DIAGNOSIS — N400 Enlarged prostate without lower urinary tract symptoms: Secondary | ICD-10-CM | POA: Insufficient documentation

## 2022-08-02 DIAGNOSIS — N390 Urinary tract infection, site not specified: Secondary | ICD-10-CM | POA: Diagnosis not present

## 2022-08-02 DIAGNOSIS — R6520 Severe sepsis without septic shock: Secondary | ICD-10-CM | POA: Diagnosis present

## 2022-08-02 DIAGNOSIS — I10 Essential (primary) hypertension: Secondary | ICD-10-CM

## 2022-08-02 DIAGNOSIS — R339 Retention of urine, unspecified: Secondary | ICD-10-CM | POA: Diagnosis not present

## 2022-08-02 DIAGNOSIS — Z833 Family history of diabetes mellitus: Secondary | ICD-10-CM | POA: Diagnosis not present

## 2022-08-02 DIAGNOSIS — Y846 Urinary catheterization as the cause of abnormal reaction of the patient, or of later complication, without mention of misadventure at the time of the procedure: Secondary | ICD-10-CM | POA: Diagnosis present

## 2022-08-02 DIAGNOSIS — E782 Mixed hyperlipidemia: Secondary | ICD-10-CM | POA: Diagnosis present

## 2022-08-02 DIAGNOSIS — E1165 Type 2 diabetes mellitus with hyperglycemia: Secondary | ICD-10-CM | POA: Diagnosis present

## 2022-08-02 DIAGNOSIS — Z8249 Family history of ischemic heart disease and other diseases of the circulatory system: Secondary | ICD-10-CM | POA: Diagnosis not present

## 2022-08-02 DIAGNOSIS — F32A Depression, unspecified: Secondary | ICD-10-CM | POA: Diagnosis present

## 2022-08-02 DIAGNOSIS — Z87442 Personal history of urinary calculi: Secondary | ICD-10-CM | POA: Diagnosis not present

## 2022-08-02 DIAGNOSIS — A4152 Sepsis due to Pseudomonas: Secondary | ICD-10-CM | POA: Diagnosis present

## 2022-08-02 DIAGNOSIS — T83511A Infection and inflammatory reaction due to indwelling urethral catheter, initial encounter: Secondary | ICD-10-CM | POA: Diagnosis present

## 2022-08-02 DIAGNOSIS — Z7984 Long term (current) use of oral hypoglycemic drugs: Secondary | ICD-10-CM | POA: Diagnosis not present

## 2022-08-02 DIAGNOSIS — N136 Pyonephrosis: Secondary | ICD-10-CM | POA: Diagnosis present

## 2022-08-02 DIAGNOSIS — N401 Enlarged prostate with lower urinary tract symptoms: Secondary | ICD-10-CM | POA: Diagnosis not present

## 2022-08-02 DIAGNOSIS — G20A1 Parkinson's disease without dyskinesia, without mention of fluctuations: Secondary | ICD-10-CM | POA: Diagnosis present

## 2022-08-02 DIAGNOSIS — Z8616 Personal history of COVID-19: Secondary | ICD-10-CM | POA: Diagnosis not present

## 2022-08-02 DIAGNOSIS — Z888 Allergy status to other drugs, medicaments and biological substances status: Secondary | ICD-10-CM | POA: Diagnosis not present

## 2022-08-02 LAB — BLOOD CULTURE ID PANEL (REFLEXED) - BCID2

## 2022-08-02 LAB — COMPREHENSIVE METABOLIC PANEL
ALT: 17 U/L (ref 0–44)
AST: 14 U/L — ABNORMAL LOW (ref 15–41)
Albumin: 3.4 g/dL — ABNORMAL LOW (ref 3.5–5.0)
Alkaline Phosphatase: 56 U/L (ref 38–126)
Anion gap: 11 (ref 5–15)
BUN: 30 mg/dL — ABNORMAL HIGH (ref 8–23)
CO2: 23 mmol/L (ref 22–32)
Calcium: 8 mg/dL — ABNORMAL LOW (ref 8.9–10.3)
Chloride: 100 mmol/L (ref 98–111)
Creatinine, Ser: 1.16 mg/dL (ref 0.61–1.24)
GFR, Estimated: >60 mL/min (ref >60)
Glucose, Bld: 130 mg/dL — ABNORMAL HIGH (ref 70–99)
Potassium: 3.6 mmol/L (ref 3.5–5.1)
Sodium: 134 mmol/L — ABNORMAL LOW (ref 135–145)
Total Bilirubin: 3.7 mg/dL — ABNORMAL HIGH (ref 0.3–1.2)
Total Protein: 6.4 g/dL — ABNORMAL LOW (ref 6.5–8.1)

## 2022-08-02 LAB — HIV ANTIBODY (ROUTINE TESTING W REFLEX): HIV Screen 4th Generation wRfx: NONREACTIVE

## 2022-08-02 LAB — CBC
HCT: 38 % — ABNORMAL LOW (ref 39.0–52.0)
Hemoglobin: 12.9 g/dL — ABNORMAL LOW (ref 13.0–17.0)
MCH: 31.1 pg (ref 26.0–34.0)
MCHC: 33.9 g/dL (ref 30.0–36.0)
MCV: 91.6 fL (ref 80.0–100.0)
Platelets: 169 K/uL (ref 150–400)
RBC: 4.15 MIL/uL — ABNORMAL LOW (ref 4.22–5.81)
RDW: 12.5 % (ref 11.5–15.5)
WBC: 18.4 K/uL — ABNORMAL HIGH (ref 4.0–10.5)
nRBC: 0 % (ref 0.0–0.2)

## 2022-08-02 LAB — GLUCOSE, CAPILLARY
Glucose-Capillary: 178 mg/dL — ABNORMAL HIGH (ref 70–99)
Glucose-Capillary: 145 mg/dL — ABNORMAL HIGH (ref 70–99)
Glucose-Capillary: 129 mg/dL — ABNORMAL HIGH (ref 70–99)
Glucose-Capillary: 134 mg/dL — ABNORMAL HIGH (ref 70–99)

## 2022-08-02 LAB — PHOSPHORUS: Phosphorus: 2.9 mg/dL (ref 2.5–4.6)

## 2022-08-02 LAB — MAGNESIUM: Magnesium: 1.3 mg/dL — ABNORMAL LOW (ref 1.7–2.4)

## 2022-08-02 LAB — CULTURE, BLOOD (SINGLE)

## 2022-08-02 MED ORDER — MORPHINE SULFATE (PF) 2 MG/ML IV SOLN: 2.0000 mg | INTRAVENOUS | Status: DC | PRN

## 2022-08-02 MED ORDER — MEMANTINE HCL 10 MG PO TABS
10.0000 mg | ORAL_TABLET | Freq: Two times a day (BID) | ORAL | Status: DC
  Administered 2022-08-02 – 2022-08-06 (×9): 10 mg via ORAL
  Filled 2022-08-02 (×9): qty 1

## 2022-08-02 MED ORDER — ACETAMINOPHEN 325 MG PO TABS
650.0000 mg | ORAL_TABLET | Freq: Four times a day (QID) | ORAL | Status: DC | PRN
  Administered 2022-08-02: 650 mg via ORAL
  Filled 2022-08-02: qty 2

## 2022-08-02 MED ORDER — LACTATED RINGERS IV SOLN: INTRAVENOUS | Status: AC

## 2022-08-02 MED ORDER — POLYETHYLENE GLYCOL 3350 17 G PO PACK
17.0000 g | PACK | Freq: Every day | ORAL | Status: DC | PRN
  Administered 2022-08-06: 17 g via ORAL
  Filled 2022-08-02: qty 1

## 2022-08-02 MED ORDER — EZETIMIBE 10 MG PO TABS
10.0000 mg | ORAL_TABLET | Freq: Every evening | ORAL | Status: DC
  Administered 2022-08-02 – 2022-08-05 (×4): 10 mg via ORAL
  Filled 2022-08-02 (×4): qty 1

## 2022-08-02 MED ORDER — ESCITALOPRAM OXALATE 10 MG PO TABS
10.0000 mg | ORAL_TABLET | Freq: Every day | ORAL | Status: DC
  Administered 2022-08-02 – 2022-08-06 (×5): 10 mg via ORAL
  Filled 2022-08-02 (×5): qty 1

## 2022-08-02 MED ORDER — MORPHINE SULFATE (PF) 4 MG/ML IV SOLN
4.0000 mg | Freq: Once | INTRAVENOUS | Status: AC
  Administered 2022-08-02: 4 mg via INTRAVENOUS
  Filled 2022-08-02: qty 1

## 2022-08-02 MED ORDER — MAGNESIUM SULFATE 2 GM/50ML IV SOLN
2.0000 g | Freq: Once | INTRAVENOUS | Status: AC
  Administered 2022-08-02: 2 g via INTRAVENOUS
  Filled 2022-08-02: qty 50

## 2022-08-02 MED ORDER — ENOXAPARIN SODIUM 40 MG/0.4ML IJ SOSY
40.0000 mg | PREFILLED_SYRINGE | INTRAMUSCULAR | Status: DC
  Administered 2022-08-02 – 2022-08-04 (×3): 40 mg via SUBCUTANEOUS
  Filled 2022-08-02 (×3): qty 0.4

## 2022-08-02 MED ORDER — SODIUM CHLORIDE 0.9 % IV SOLN
3.0000 g | Freq: Once | INTRAVENOUS | Status: AC
  Administered 2022-08-02: 3 g via INTRAVENOUS
  Filled 2022-08-02: qty 8

## 2022-08-02 MED ORDER — PHENAZOPYRIDINE HCL 100 MG PO TABS: 200.0000 mg | ORAL_TABLET | Freq: Three times a day (TID) | ORAL | Status: DC | PRN

## 2022-08-02 MED ORDER — METOPROLOL TARTRATE 5 MG/5ML IV SOLN: 5.0000 mg | INTRAVENOUS | Status: DC | PRN

## 2022-08-02 MED ORDER — CARBIDOPA-LEVODOPA ER 50-200 MG PO TBCR
1.0000 | EXTENDED_RELEASE_TABLET | Freq: Three times a day (TID) | ORAL | Status: DC
  Administered 2022-08-02 – 2022-08-06 (×13): 1 via ORAL
  Filled 2022-08-02 (×13): qty 1

## 2022-08-02 MED ORDER — ONDANSETRON HCL 4 MG/2ML IJ SOLN
4.0000 mg | Freq: Four times a day (QID) | INTRAMUSCULAR | Status: DC | PRN
  Administered 2022-08-02 – 2022-08-05 (×2): 4 mg via INTRAVENOUS
  Filled 2022-08-02 (×2): qty 2

## 2022-08-02 MED ORDER — DOCUSATE SODIUM 100 MG PO CAPS
100.0000 mg | ORAL_CAPSULE | Freq: Two times a day (BID) | ORAL | Status: DC
  Administered 2022-08-02 – 2022-08-06 (×9): 100 mg via ORAL
  Filled 2022-08-02 (×9): qty 1

## 2022-08-02 MED ORDER — TRAZODONE HCL 50 MG PO TABS: 50.0000 mg | ORAL_TABLET | Freq: Every evening | ORAL | Status: DC | PRN

## 2022-08-02 MED ORDER — KETOROLAC TROMETHAMINE 15 MG/ML IJ SOLN
15.0000 mg | Freq: Once | INTRAMUSCULAR | Status: AC
  Administered 2022-08-02: 15 mg via INTRAVENOUS
  Filled 2022-08-02: qty 1

## 2022-08-02 MED ORDER — INSULIN ASPART 100 UNIT/ML IJ SOLN
0.0000 [IU] | Freq: Three times a day (TID) | INTRAMUSCULAR | Status: DC
  Administered 2022-08-02: 1 [IU] via SUBCUTANEOUS
  Administered 2022-08-02: 2 [IU] via SUBCUTANEOUS
  Administered 2022-08-02: 1 [IU] via SUBCUTANEOUS
  Administered 2022-08-03: 2 [IU] via SUBCUTANEOUS
  Administered 2022-08-03: 1 [IU] via SUBCUTANEOUS
  Administered 2022-08-04: 3 [IU] via SUBCUTANEOUS
  Administered 2022-08-05 (×2): 1 [IU] via SUBCUTANEOUS
  Administered 2022-08-06: 3 [IU] via SUBCUTANEOUS

## 2022-08-02 MED ORDER — ROSUVASTATIN CALCIUM 10 MG PO TABS
10.0000 mg | ORAL_TABLET | Freq: Every evening | ORAL | Status: DC
  Administered 2022-08-02 – 2022-08-05 (×4): 10 mg via ORAL
  Filled 2022-08-02 (×4): qty 1

## 2022-08-02 MED ORDER — IPRATROPIUM-ALBUTEROL 0.5-2.5 (3) MG/3ML IN SOLN: 3.0000 mL | RESPIRATORY_TRACT | Status: DC | PRN

## 2022-08-02 MED ORDER — TAMSULOSIN HCL 0.4 MG PO CAPS
0.4000 mg | ORAL_CAPSULE | Freq: Every day | ORAL | Status: DC
  Administered 2022-08-02 – 2022-08-05 (×4): 0.4 mg via ORAL
  Filled 2022-08-02 (×4): qty 1

## 2022-08-02 MED ORDER — SENNOSIDES-DOCUSATE SODIUM 8.6-50 MG PO TABS: 1.0000 | ORAL_TABLET | Freq: Every evening | ORAL | Status: DC | PRN

## 2022-08-02 MED ORDER — CHLORHEXIDINE GLUCONATE CLOTH 2 % EX PADS
6.0000 | MEDICATED_PAD | Freq: Every day | CUTANEOUS | Status: DC
  Administered 2022-08-02 – 2022-08-06 (×5): 6 via TOPICAL

## 2022-08-02 MED ORDER — HYDRALAZINE HCL 20 MG/ML IJ SOLN: 10.0000 mg | INTRAMUSCULAR | Status: DC | PRN

## 2022-08-02 MED ORDER — ONDANSETRON HCL 4 MG PO TABS
4.0000 mg | ORAL_TABLET | Freq: Four times a day (QID) | ORAL | Status: DC | PRN
  Administered 2022-08-02 – 2022-08-03 (×2): 4 mg via ORAL
  Filled 2022-08-02 (×2): qty 1

## 2022-08-02 MED ORDER — ACETAMINOPHEN 650 MG RE SUPP: 650.0000 mg | Freq: Four times a day (QID) | RECTAL | Status: DC | PRN

## 2022-08-02 MED ORDER — LOSARTAN POTASSIUM 50 MG PO TABS
50.0000 mg | ORAL_TABLET | Freq: Every day | ORAL | Status: DC
  Administered 2022-08-02 – 2022-08-06 (×5): 50 mg via ORAL
  Filled 2022-08-02 (×5): qty 1

## 2022-08-02 NOTE — Progress Notes (Signed)
Patient admitted this morning by Dr. Thomes Dinning  Seen and examined at bedside.  67 year old with history of HTN, DM2, HLD, Parkinson disease, BPH admitted for fever.  Recently hide renal stones with lithotripsy on 07/14/2022 with stent placement and Foley catheter placement.  Upon admission found to be septic secondary to urinary tract infection.  Patient was started on IV cefepime and following culture data at this time.  Seen at bedside, still overall feels weak.  Spouse is present at bedside as well.  Currently Foley catheter in place.  Normal sinus rhythm, clear to auscultation bilaterally.  Severe sepsis secondary to catheter associated urinary tract infection-present on admission.  Continue IV cefepime, follow culture data.  IV fluids, lactic acidosis has improved.  Supportive care. Foley changed in ED. urine culture not sent on admission, separately ordered.  Unfortunately patient is already on antibiotics  Dehydration-IV fluids  Nephrolithiasis with lithotripsy Jul 15, 2022 with stent placement-performed by Dr. Alvester Morin.  CT scan showing improvement in obstructive pathology  Diabetes mellitus type 2, uncontrolled with hyperglycemia-sliding scale and Accu-Cheks  Hypomagnesemia-repletion  Essential hypertension-losartan.  IV as needed  Mixed hyperlipidemia-continue Crestor and Zetia  Parkinson's disease-Sinemet  History of BPH-Flomax  Stephania Fragmin MD [facility]

## 2022-08-02 NOTE — Progress Notes (Signed)
Patient admitted at after Temp, BP, and pulse came down from receiving Acetaminophen in ED. Charge Nurse Tana Coast RN  and [facility] RN made aware patient not stable and awaiting orders for medications at to bring down temp at time of report at 0229. Full set of vitals requested during report Temp had spiked up for prior two hour vitals.   Patient admitted to [address] Floor at 0413 after medication was given to patient and new set of vitals obtained post medication. Admitting vitals Temp 101.2 orally, BP 128/81, pulse 103, Respirations 19, Oxygen sat 93% Room air, no complaints of pain from patient. Patient is alert and oriented. Dr. Thomes Dinning notified, Ketorolac given per order and [place] protocol started.   Patient returned to [place] at 05:41, vitals temp 100.2 orally, BP 102/76, Pulse 95, respirations 18, Oxygen 95 room air., Dr. Thomes Dinning updated on condition. Continued to monitor.

## 2022-08-02 NOTE — ED Notes (Signed)
Patient temp 102.9 oral. MD notified; orders for Tylenol placed by provider. Will delay patient transport to 300 due to MEWS.

## 2022-08-02 NOTE — TOC CM/SW Note (Signed)
Transition of Care [facility]) - Inpatient Brief Assessment   Patient Details  Name: Leonard Mercer MRN: [id number] Date of Birth: Jun 23, 1955  Transition of Care [facility]) CM/SW Contact:    Elliot Gault, LCSW Phone Number: 08/02/2022, 9:47 AM   Clinical Narrative:  Transition of Care Department [facility]) has reviewed patient and no TOC needs have been identified at this time. We will continue to monitor patient advancement through interdisciplinary progression rounds. If new patient transition needs arise, please place a TOC consult.  Transition of Care Asessment: Insurance and Status: Insurance coverage has been reviewed Patient has primary care physician: Yes Home environment has been reviewed: from home with spouse Prior level of function:: independent Prior/Current Home Services: No current home services Social Determinants of Health Reivew: SDOH reviewed no interventions necessary Readmission risk has been reviewed: Yes Transition of care needs: no transition of care needs at this time

## 2022-08-02 NOTE — H&P (Signed)
History and Physical    Patient: Leonard Mercer WUJ:[id number] DOB: 1955-07-30 DOA: 08/01/2022 DOS: the patient was seen and examined on 08/02/2022 PCP: Richmond Campbell., PA-C  Patient coming from: Home  Chief Complaint:  Chief Complaint  Patient presents with   Post-op Problem   HPI: Leonard Mercer is a 67 y.o. male with medical history significant of hypertension, type 2 diabetes mellitus, hyperlipidemia, Parkinson's disease, BPH who presents to the emergency department due to fever.  Patient had ureteral lithiasis with lithotripsy on 07/14/2022 with stent placement and Foley catheter placement.  He complained of intermittent burning sensation at the urethra and now complained of fever yesterday and this was associated with chills, nausea and vomiting as well as suprapubic pain.  He denies chest pain, shortness of breath, headache.  ED Course:  In the emergency department, he was febrile with a temperature of 102.9 and tachycardic.  BP was 154/97 and other vital signs were within normal range.  Workup in the ED showed normal CBC except for leukocytosis with WBC of 21.  BMP was normal except for blood glucose of 187 and BUN of 30 lactic acid was 1.7  > 2.2.  Urinalysis was positive for UTI.  Magnesium 1.3.  Blood culture pending. Patient was treated with Unasyn and cefepime, morphine was given due to pain, Zofran was given due to nausea and vomiting.  Tylenol and Toradol were given due to fever.  Hospitalist was asked to admit patient for further evaluation and management.   Review of Systems: Review of systems as noted in the HPI. All other systems reviewed and are negative.   Past Medical History:  Diagnosis Date   Chest pain    Complication of anesthesia    Prolonged sedation   Diabetes mellitus without complication (HCC)    History of COVID-19    Hypercholesteremia    Hypertension    Kidney stone    Memory disorder 11/07/2018   Parkinson's disease 09/12/2016   Past Surgical  History:  Procedure Laterality Date   CYSTOSCOPY/URETEROSCOPY/HOLMIUM LASER/STENT PLACEMENT Right 07/15/2022   Procedure: CYSTOSCOPY RIGHT URETEROSCOPY/HOLMIUM LASER/STENT PLACEMENT;  Surgeon: Crista Elliot, MD;  Location: [facility];  Service: Urology;  Laterality: Right;  1 HR  FOR CASE   HERNIA REPAIR      Social History:  reports that he has never smoked. He has never used smokeless tobacco. He reports current alcohol use. He reports that he does not use drugs.   Allergies  Allergen Reactions   Ace Inhibitors Other (See Comments)    unknown   Donepezil Hcl Other (See Comments)    Stomach upset Stomach upset   Sulfamethoxazole-Trimethoprim Nausea And Vomiting   Pravastatin Rash    Family History  Problem Relation Age of Onset   Hypertension Mother    Diabetes Father    Heart disease Maternal Grandfather    Parkinson's disease Neg Hx      Prior to Admission medications   Medication Sig Start Date End Date Taking? Authorizing Provider  carbidopa-levodopa (SINEMET CR) 50-200 MG tablet Take 1 tablet by mouth 3 (three) times daily. 07/25/22   Huston Foley, MD  carbidopa-levodopa (SINEMET IR) 25-100 MG tablet Take 1 tablet by mouth 4 (four) times daily. 07/25/22   Huston Foley, MD  cephALEXin (KEFLEX) 500 MG capsule Take 1 capsule (500 mg total) by mouth 2 (two) times daily. 07/16/22   Zadie Rhine, MD  escitalopram (LEXAPRO) 10 MG tablet Take 1 tablet (10 mg total) by mouth  daily. 07/25/22   Huston Foley, MD  ezetimibe (ZETIA) 10 MG tablet Take 10 mg by mouth every evening. 03/24/21   [provider]  losartan (COZAAR) 50 MG tablet Take 50 mg by mouth daily with lunch. 08/21/20   [provider]  Melatonin 10 MG TABS Take 10 mg by mouth at bedtime as needed (sleep.).    [provider]  memantine (NAMENDA) 10 MG tablet Take 1 tablet (10 mg total) by mouth 2 (two) times daily. 09/20/21   Ihor Austin, NP  metFORMIN (GLUCOPHAGE) 1000 MG tablet Take 1,000 mg by  mouth 2 (two) times daily with a meal.    [provider]  ondansetron (ZOFRAN) 4 MG tablet Take 1 tablet (4 mg total) by mouth every 8 (eight) hours as needed for nausea or vomiting. Patient taking differently: Take 4 mg by mouth as needed for nausea or vomiting. 07/10/22   Terrilee Files, MD  oxyCODONE-acetaminophen (PERCOCET/ROXICET) 5-325 MG tablet Take 1 tablet by mouth every 6 (six) hours as needed for severe pain. 07/10/22   Terrilee Files, MD  phenazopyridine (PYRIDIUM) 200 MG tablet Take 1 tablet (200 mg total) by mouth 3 (three) times daily as needed for pain. 07/17/22   Horton, Mayer Masker, MD  polyethylene glycol (MIRALAX / GLYCOLAX) 17 g packet Take 17 g by mouth daily as needed (constipation.).    [provider]  rosuvastatin (CRESTOR) 10 MG tablet Take 10 mg by mouth every evening. 09/02/21   [provider]  tamsulosin (FLOMAX) 0.4 MG CAPS capsule Take 1 capsule (0.4 mg total) by mouth daily after supper. 07/16/22   Zadie Rhine, MD    Physical Exam: BP 102/76   Pulse 95   Temp 100.2 F (37.9 C) (Oral)   Resp 18   Ht 5\' 11"  (1.803 m)   Wt 73.1 kg   SpO2 95%   BMI 22.48 kg/m   General: 67 y.o. year-old male ill appearing, but in no acute distress.  Alert and oriented x3. HEENT: NCAT, EOMI, dry mucous membrane Neck: Supple, trachea medial Cardiovascular: Tachycardic.  Regular rate and rhythm with no rubs or gallops.  No thyromegaly or JVD noted.  No lower extremity edema. 2/4 pulses in all 4 extremities. Respiratory: Clear to auscultation with no wheezes or rales. Good inspiratory effort. Abdomen: Soft, nontender nondistended with normal bowel sounds x4 quadrants. Muskuloskeletal: No cyanosis, clubbing or edema noted bilaterally Neuro: CN II-XII intact, tremor at rest was noted.  Sensation, reflexes intact Skin: No ulcerative lesions noted or rashes Psychiatry: Judgement and insight appear normal. Mood is appropriate for condition and  setting          Labs on Admission:  Basic Metabolic Panel: Recent Labs  Lab 08/01/22 1844 08/02/22 0425  NA 136 134*  K 3.9 3.6  CL 99 100  CO2 25 23  GLUCOSE 187* 130*  BUN 30* 30*  CREATININE 1.15 1.16  CALCIUM 8.6* 8.0*  MG  --  1.3*  PHOS  --  2.9   Liver Function Tests: Recent Labs  Lab 08/02/22 0425  AST 14*  ALT 17  ALKPHOS 56  BILITOT 3.7*  PROT 6.4*  ALBUMIN 3.4*   No results for input(s): "LIPASE", "AMYLASE" in the last 168 hours. No results for input(s): "AMMONIA" in the last 168 hours. CBC: Recent Labs  Lab 08/01/22 1844 08/02/22 0425  WBC 21.0* 18.4*  HGB 13.9 12.9*  HCT 42.3 38.0*  MCV 92.8 91.6  PLT 226 169  Cardiac Enzymes: No results for input(s): "CKTOTAL", "CKMB", "CKMBINDEX", "TROPONINI" in the last 168 hours.  BNP (last 3 results) No results for input(s): "BNP" in the last 8760 hours.  ProBNP (last 3 results) No results for input(s): "PROBNP" in the last 8760 hours.  CBG: No results for input(s): "GLUCAP" in the last 168 hours.  Radiological Exams on Admission: CT Renal Stone Study  Result Date: 08/01/2022 CLINICAL DATA:  History of urolithiasis, with lithotripsy and stent placement last month, Foley catheter and right stenting still in place. Presents with suspected sepsis, with abdominal pain, fever, and vomiting as well as decreased urine output. EXAM: CT ABDOMEN AND PELVIS WITHOUT CONTRAST TECHNIQUE: Multidetector CT imaging of the abdomen and pelvis was performed following the standard protocol without IV contrast. RADIATION DOSE REDUCTION: This exam was performed according to the departmental dose-optimization program which includes automated exposure control, adjustment of the mA and/or kV according to patient size and/or use of iterative reconstruction technique. COMPARISON:  CT without contrast 07/10/2022, CT with contrast 08/08/2020. FINDINGS: Lower chest: Scattered linear scar-like opacities in the lung bases. No acute  process. Small hiatal hernia. The cardiac size is normal. Calcification in the right coronary artery again is noted. No pericardial fluid. Hepatobiliary: The liver is mildly steatotic with homogeneous attenuation. No masses seen without contrast. The gallbladder and bile ducts are unremarkable. Pancreas: Some images may suggest trace stranding along side the pancreatic neck and body but this is not confirmed in all imaging planes. Correlate clinically with serum lipase for possible pancreatitis. There is fatty infiltration of the pancreas as seen previously. No masses seen without contrast. Spleen: Unremarkable without contrast.  No splenomegaly. Adrenals/Urinary Tract: There is no adrenal mass. There previously was mild right hydroureteronephrosis above a 7 mm stone at L4-5. There has been interval right ureteral stenting with the upper loop in the renal pelvis and the lower loop in the bladder superiorly in the midline. The right ureter and collecting system have decompressed in the interval. At the L5 level there are 2 adjacent stones in the ureter posterior to the stent, 1 measuring 2 mm and the other measuring 3 mm, on 2:42. No other ureteral stones are seen. No stone debris is visible in the contracted bladder. The bladder wall is contracted and not well evaluated. The bladder is catheterized. Asymmetric right perinephric stranding edema is still seen and could relate to obstructive uropathy or inflammatory process but is unchanged since the last CT. There is trace right periureteric retroperitoneal fluid and stranding. Again noted are nonobstructive caliceal stones in the left lower pole up to 9 mm, with occasional punctate caliceal stones in the upper pole. No intrarenal stone is seen on the right. Stomach/Bowel: No bowel obstruction or inflammatory change. Normal appendix. Mild-to-moderate retained stool transverse and descending colon. Uncomplicated sigmoid diverticulosis. Vascular/Lymphatic: Aortic  atherosclerosis. No enlarged abdominal or pelvic lymph nodes. Reproductive: Prostatomegaly, transverse prostate axis 4.8 cm. Other: Small inguinal fat hernias. As above trace right retroperitoneal fluid and periureteric stranding. No pelvic ascites. No free air, abscess or free hemorrhage. Musculoskeletal: Moderate to severe levoscoliosis apex L1-2. Degenerative changes lumbar spine and osteopenia. No spinal compression fractures or aggressive lesions. IMPRESSION: 1. Interval right ureteral stenting with 2 adjacent stones in the ureter at the L5 level, one measuring 2 mm and the other 3 mm. 2. Interval decompression of the right ureter and collecting system. 3. Asymmetric right perinephric as well as periureteral stranding edema is still seen and could relate to the now resolved  obstructive uropathy or inflammatory process, but is unchanged since the last CT. 4. Nonobstructive left nephrolithiasis. 5. Constipation and diverticulosis. 6. Prostatomegaly. 7. Aortic and coronary artery atherosclerosis. 8. Query trace stranding at the pancreatic neck and body. Correlate clinically for pancreatitis. Aortic Atherosclerosis (ICD10-I70.0). Electronically Signed   By: Almira Bar M.D.   On: 08/01/2022 23:13    EKG: I independently viewed the EKG done and my findings are as followed: EKG was not done in the ED  Assessment/Plan Present on Admission:  Sepsis secondary to UTI [facility])  Essential hypertension  Mixed hyperlipidemia  Parkinson disease  Principal Problem:   Sepsis secondary to UTI [facility]) Active Problems:   Parkinson disease   Mixed hyperlipidemia   Essential hypertension   Lactic acidosis   Nausea & vomiting   Abdominal pain   Dehydration   Type 2 diabetes mellitus with hyperglycemia (HCC)   Hypomagnesemia   BPH (benign prostatic hyperplasia)  Sepsis secondary to UTI Patient meets sepsis criteria due to leukocytosis, tachycardia and source of infection as UTI He was started with Unasyn and  cefepime, we shall continue with IV cefepime at this time Continue Tylenol as needed for fever Blood culture and urine culture pending  Lactic acidosis-resolved Lactic acid 2.2 > 1.7  Nausea, vomiting, abdominal pain Continue IV NS at 100 mLs/Hr Continue IV Zosyn 3.375 q.8h Continue IV morphine 2 mg q.4h p.r.n. for moderate to severe pain Continue IV Zofran/Phenergan p.r.n. Continue clear liquid diet with plan to advanced diet as tolerated Obtain blood culture x2 Surgery  will be consulted to follow up with patient in the morning  Dehydration Continue IV hydration  Type 2 diabetes mellitus with hyperglycemia Hemoglobin A1c on 07/15/2022 was 8.6 Continue ISS and hypoglycemic protocol  Hypomagnesemia Mg level is 1.3 This will be replenished Please continue to monitor Mg level and correct accordingly  Essential hypertension Continue losartan  Mixed hyperlipidemia Continue Crestor and Zetia  Parkinson disease Continue Sinemet  BPH Continue Flomax  DVT prophylaxis: Lovenox    Advance Care Planning:   Code Status: Full Code   Consults: None  Family Communication: None at bedside  Severity of Illness: The appropriate patient status for this patient is INPATIENT. Inpatient status is judged to be reasonable and necessary in order to provide the required intensity of service to ensure the patient's safety. The patient's presenting symptoms, physical exam findings, and initial radiographic and laboratory data in the context of their chronic comorbidities is felt to place them at high risk for further clinical deterioration. Furthermore, it is not anticipated that the patient will be medically stable for discharge from the hospital within 2 midnights of admission.   * I certify that at the point of admission it is my clinical judgment that the patient will require inpatient hospital care spanning beyond 2 midnights from the point of admission due to high intensity of service,  high risk for further deterioration and high frequency of surveillance required.*  Author: Frankey Shown, DO 08/02/2022 8:15 AM  For on call review [email or web address].

## 2022-08-02 NOTE — Consult Note (Signed)
Urology Consult  Referring physician: Dr. Nelson Chimes Reason for referral: sepssi after ureteroscopy  Chief Complaint: fever  History of Present Illness: Leonard Mercer is a 66yo with a history of nephrolithiasis and BPH who was admitted with sepsis from a urinary source. He underwent right ureteroscopic stone extraction with Dr. Alvester Morin on 5/31. There was concern at the time of surgery for extrusion of stone fragments outside the ureter.  After discharge he developed urinary retention and required foley catheter placement. He was scheduled fo voiding trial and ureteral stent removal in 1 week. For the past 3 days he has felt weak, he has had chills and he was having fevers at home. On presentation to the ER Sepsis protocol was initiated and patient was started on broad spectrum antibiotics. CT obtained in the ER shows a right ureteral stent in good position with 2 mid proximal calculi likely outside the ureter. Urine culture is currently pending. No other complaints today.   Past Medical History:  Diagnosis Date   Chest pain    Complication of anesthesia    Prolonged sedation   Diabetes mellitus without complication (HCC)    History of COVID-19    Hypercholesteremia    Hypertension    Kidney stone    Memory disorder 11/07/2018   Parkinson's disease 09/12/2016   Past Surgical History:  Procedure Laterality Date   CYSTOSCOPY/URETEROSCOPY/HOLMIUM LASER/STENT PLACEMENT Right 07/15/2022   Procedure: CYSTOSCOPY RIGHT URETEROSCOPY/HOLMIUM LASER/STENT PLACEMENT;  Surgeon: Crista Elliot, MD;  Location: [facility];  Service: Urology;  Laterality: Right;  1 HR  FOR CASE   HERNIA REPAIR      Medications: I have reviewed the patient's current medications. Allergies:  Allergies  Allergen Reactions   Ace Inhibitors Other (See Comments)    unknown   Donepezil Hcl Other (See Comments)    Stomach upset Stomach upset   Sulfamethoxazole-Trimethoprim Nausea And Vomiting   Pravastatin Rash    Family History   Problem Relation Age of Onset   Hypertension Mother    Diabetes Father    Heart disease Maternal Grandfather    Parkinson's disease Neg Hx    Social History:  reports that he has never smoked. He has never used smokeless tobacco. He reports current alcohol use. He reports that he does not use drugs.  Review of Systems  Constitutional:  Positive for chills, fatigue and fever.  All other systems reviewed and are negative.   Physical Exam:  Vital signs in last 24 hours: Temp:  [97.7 F (36.5 C)-102.9 F (39.4 C)] 97.7 F (36.5 C) (06/18 1112) Pulse Rate:  [72-105] 72 (06/18 1112) Resp:  [15-22] 17 (06/18 1112) BP: (102-154)/(76-97) 104/79 (06/18 1112) SpO2:  [93 %-99 %] 96 % (06/18 1112) Weight:  [73.1 kg] 73.1 kg (06/18 0414) Physical Exam Vitals reviewed.  Constitutional:      Appearance: Normal appearance.  HENT:     Head: Normocephalic and atraumatic.     Nose: Nose normal. No congestion.     Mouth/Throat:     Mouth: Mucous membranes are dry.  Eyes:     Extraocular Movements: Extraocular movements intact.     Pupils: Pupils are equal, round, and reactive to light.  Cardiovascular:     Rate and Rhythm: Normal rate and regular rhythm.  Pulmonary:     Effort: Pulmonary effort is normal.     Breath sounds: Normal breath sounds.  Abdominal:     General: Abdomen is flat. There is no distension.  Musculoskeletal:  General: No swelling. Normal range of motion.     Cervical back: Normal range of motion and neck supple.  Skin:    General: Skin is warm and dry.  Neurological:     General: No focal deficit present.     Mental Status: He is alert and oriented to person, place, and time.  Psychiatric:        Mood and Affect: Mood normal.        Behavior: Behavior normal.        Thought Content: Thought content normal.        Judgment: Judgment normal.     Laboratory Data:  Results for orders placed or performed during the hospital encounter of 08/01/22 (from the  past 72 hour(s))  Basic metabolic panel     Status: Abnormal   Collection Time: 08/01/22  6:44 PM  Result Value Ref Range   Sodium 136 135 - 145 mmol/L   Potassium 3.9 3.5 - 5.1 mmol/L   Chloride 99 98 - 111 mmol/L   CO2 25 22 - 32 mmol/L   Glucose, Bld 187 (H) 70 - 99 mg/dL    Comment: Glucose reference range applies only to samples taken after fasting for at least 8 hours.   BUN 30 (H) 8 - 23 mg/dL   Creatinine, Ser 6.29 0.61 - 1.24 mg/dL   Calcium 8.6 (L) 8.9 - 10.3 mg/dL   GFR, Estimated >52 >84 mL/min    Comment: (NOTE) Calculated using the CKD-EPI Creatinine Equation (2021)    Anion gap 12 5 - 15    Comment: Performed at [facility], [address], [place], [place] [address]  CBC     Status: Abnormal   Collection Time: 08/01/22  6:44 PM  Result Value Ref Range   WBC 21.0 (H) 4.0 - 10.5 K/uL   RBC 4.56 4.22 - 5.81 MIL/uL   Hemoglobin 13.9 13.0 - 17.0 g/dL   HCT 01.0 27.2 - 53.6 %   MCV 92.8 80.0 - 100.0 fL   MCH 30.5 26.0 - 34.0 pg   MCHC 32.9 30.0 - 36.0 g/dL   RDW 64.4 03.4 - 74.2 %   Platelets 226 150 - 400 K/uL   nRBC 0.0 0.0 - 0.2 %    Comment: Performed at [facility], [address], [place], [place] [address]  Lactic acid, plasma     Status: Abnormal   Collection Time: 08/01/22  6:44 PM  Result Value Ref Range   Lactic Acid, Venous 2.2 (HH) 0.5 - 1.9 mmol/L    Comment: CRITICAL RESULT CALLED TO, READ BACK BY AND VERIFIED WITH CARTER,C ON 08/01/22 AT 2015 BY LOY,C Performed at [facility], [address] [place], [place] [address]   Lactic acid, plasma     Status: None   Collection Time: 08/01/22  7:44 PM  Result Value Ref Range   Lactic Acid, Venous 1.7 0.5 - 1.9 mmol/L    Comment: Performed at [facility], [address], [place], [place] [address]  Culture, blood (single)     Status: None (Preliminary result)   Collection Time: 08/01/22  9:12 PM   Specimen: Right Antecubital; Blood  Result Value Ref Range   Specimen Description RIGHT ANTECUBITAL     Special Requests      BOTTLES DRAWN AEROBIC AND ANAEROBIC Blood Culture adequate volume   Culture  Setup Time      AEROBIC BOTTLE ONLY GRAM NEGATIVE RODS Gram Stain Report Called to,Read Back By and Verified With: Mastro,  DEANNA @ 1614 ON 08/02/2022 BY FRATTO,ASHLEY Performed at [facility], [address], [place], [place] [address]    Culture PENDING    Report Status PENDING   Urinalysis, Routine w reflex microscopic -Urine, Catheterized; Indwelling urinary catheter     Status: Abnormal   Collection Time: 08/01/22 10:27 PM  Result Value Ref Range   Color, Urine AMBER (A) YELLOW    Comment: BIOCHEMICALS MAY BE AFFECTED BY COLOR   APPearance TURBID (A) CLEAR   Specific Gravity, Urine 1.015 1.005 - 1.030   pH 5.0 5.0 - 8.0   Glucose, UA NEGATIVE NEGATIVE mg/dL   Hgb urine dipstick LARGE (A) NEGATIVE   Bilirubin Urine NEGATIVE NEGATIVE   Ketones, ur 5 (A) NEGATIVE mg/dL   Protein, ur 604 (A) NEGATIVE mg/dL   Nitrite POSITIVE (A) NEGATIVE   Leukocytes,Ua LARGE (A) NEGATIVE   RBC / HPF >50 0 - 5 RBC/hpf   WBC, UA >50 0 - 5 WBC/hpf   Bacteria, UA MANY (A) NONE SEEN   Squamous Epithelial / HPF 0-5 0 - 5 /HPF   WBC Clumps PRESENT    Mucus PRESENT    Amorphous Crystal PRESENT     Comment: Performed at [facility], [address], [place], [place] [address]  HIV Antibody (routine testing w rflx)     Status: None   Collection Time: 08/02/22  4:25 AM  Result Value Ref Range   HIV Screen 4th Generation wRfx Non Reactive Non Reactive    Comment: Performed at [facility], [address], [place], [place] [address]  Comprehensive metabolic panel     Status: Abnormal   Collection Time: 08/02/22  4:25 AM  Result Value Ref Range   Sodium 134 (L) 135 - 145 mmol/L   Potassium 3.6 3.5 - 5.1 mmol/L   Chloride 100 98 - 111 mmol/L   CO2 23 22 - 32 mmol/L   Glucose, Bld 130 (H) 70 - 99 mg/dL    Comment: Glucose reference range applies only to samples taken after fasting for at least 8  hours.   BUN 30 (H) 8 - 23 mg/dL   Creatinine, Ser 7.82 0.61 - 1.24 mg/dL   Calcium 8.0 (L) 8.9 - 10.3 mg/dL   Total Protein 6.4 (L) 6.5 - 8.1 g/dL   Albumin 3.4 (L) 3.5 - 5.0 g/dL   AST 14 (L) 15 - 41 U/L   ALT 17 0 - 44 U/L   Alkaline Phosphatase 56 38 - 126 U/L   Total Bilirubin 3.7 (H) 0.3 - 1.2 mg/dL   GFR, Estimated >95 >62 mL/min    Comment: (NOTE) Calculated using the CKD-EPI Creatinine Equation (2021)    Anion gap 11 5 - 15    Comment: Performed at [facility], [address], [place], [place] [address]  CBC     Status: Abnormal   Collection Time: 08/02/22  4:25 AM  Result Value Ref Range   WBC 18.4 (H) 4.0 - 10.5 K/uL   RBC 4.15 (L) 4.22 - 5.81 MIL/uL   Hemoglobin 12.9 (L) 13.0 - 17.0 g/dL   HCT 57.8 (L) 46.9 - 62.9 %   MCV 91.6 80.0 - 100.0 fL   MCH 31.1 26.0 - 34.0 pg   MCHC 33.9 30.0 - 36.0 g/dL   RDW 52.8 41.3 - 24.4 %   Platelets 169 150 - 400 K/uL   nRBC 0.0 0.0 - 0.2 %    Comment: Performed at [facility], [address] [place],  [place] [address]  Magnesium     Status: Abnormal   Collection Time: 08/02/22  4:25 AM  Result Value Ref Range   Magnesium 1.3 (L) 1.7 - 2.4 mg/dL    Comment: Performed at [facility], [address] [place], [place] [address]  Phosphorus     Status: None   Collection Time: 08/02/22  4:25 AM  Result Value Ref Range   Phosphorus 2.9 2.5 - 4.6 mg/dL    Comment: Performed at [facility], [address] [place], [place] [address]  Glucose, capillary     Status: Abnormal   Collection Time: 08/02/22  8:43 AM  Result Value Ref Range   Glucose-Capillary 178 (H) 70 - 99 mg/dL    Comment: Glucose reference range applies only to samples taken after fasting for at least 8 hours.  Glucose, capillary     Status: Abnormal   Collection Time: 08/02/22 11:10 AM  Result Value Ref Range   Glucose-Capillary 145 (H) 70 - 99 mg/dL    Comment: Glucose reference range applies only to samples taken after fasting for at least 8 hours.  Glucose,  capillary     Status: Abnormal   Collection Time: 08/02/22  4:10 PM  Result Value Ref Range   Glucose-Capillary 129 (H) 70 - 99 mg/dL    Comment: Glucose reference range applies only to samples taken after fasting for at least 8 hours.   Recent Results (from the past 240 hour(s))  Culture, blood (single)     Status: None (Preliminary result)   Collection Time: 08/01/22  9:12 PM   Specimen: Right Antecubital; Blood  Result Value Ref Range Status   Specimen Description RIGHT ANTECUBITAL  Final   Special Requests   Final    BOTTLES DRAWN AEROBIC AND ANAEROBIC Blood Culture adequate volume   Culture  Setup Time   Final    AEROBIC BOTTLE ONLY GRAM NEGATIVE RODS Gram Stain Report Called to,Read Back By and Verified With: Jeanmarie Hubert @ 1614 ON 08/02/2022 BY FRATTO,ASHLEY Performed at [facility], [address] [place], [place] [address]    Culture PENDING  Incomplete   Report Status PENDING  Incomplete   Creatinine: Recent Labs    08/01/22 1844 08/02/22 0425  CREATININE 1.15 1.16   Baseline Creatinine: 1.1  Impression/Assessment:  66yo with sepsis from a urinary source, urinary retention, nephrolithiasis  Plan:  Sepsis: Please continue broad spectrum antibiotics pending his urine culture. The patient will require 10-14 days of culture specific antibiotics. Urinary retention: Please continue indwelling foley. Please continue flomax 0.4mg  daily. The patient will followup in 2 weeks after discharge for a voiding trial  Nephrolithiasis: I discussed the management of extraluminal calculi and we agree the patient would benefit from a prolonged indwelling stent. The patient will followup with Dr. Alvester Morin 2 weeks after discharge for ureteral stent removal  Leonard Mercer 08/02/2022, 7:45 PM

## 2022-08-02 NOTE — ED Notes (Signed)
ED TO INPATIENT HANDOFF REPORT  ED Nurse Name and Phone #: Jacques Earthly Name/Age/Gender Leonard Mercer 68 y.o. male Room/Bed: APA15/APA15  Code Status   Code Status: Not on file  Home/SNF/Other Home Patient oriented to: self, place, time, and situation Is this baseline? Yes   Triage Complete: Triage complete  Chief Complaint Sepsis secondary to UTI (HCC) [A41.9, N39.0]  Triage Note Pt via POV after renal stent placement 2 weeks ago after lithotripsy procedure. Pt c/o fever, n/v, bilateral flank pain 7/10, decreased urine output in indwelling catheter. Pt is actively vomiting in triage.    Allergies Allergies  Allergen Reactions   Ace Inhibitors Other (See Comments)    unknown   Donepezil Hcl Other (See Comments)    Stomach upset Stomach upset   Sulfamethoxazole-Trimethoprim Nausea And Vomiting   Pravastatin Rash    Level of Care/Admitting Diagnosis ED Disposition     ED Disposition  Admit   Condition  --   Comment  Hospital Area: [facility] [[id number]]  Level of Care: Med-Surg [16]  Covid Evaluation: Asymptomatic - no recent exposure (last 10 days) testing not required  Diagnosis: Sepsis secondary to UTI [facility]) [[id number]]  Admitting Physician: Frankey Shown [[id number]]  Attending Physician: Frankey Shown [[id number]]  Certification:: I certify this patient will need inpatient services for at least 2 midnights  Estimated Length of Stay: 3          B Medical/Surgery History Past Medical History:  Diagnosis Date   Chest pain    Complication of anesthesia    Prolonged sedation   Diabetes mellitus without complication (HCC)    History of COVID-19    Hypercholesteremia    Hypertension    Kidney stone    Memory disorder 11/07/2018   Parkinson's disease 09/12/2016   Past Surgical History:  Procedure Laterality Date   CYSTOSCOPY/URETEROSCOPY/HOLMIUM LASER/STENT PLACEMENT Right 07/15/2022   Procedure: CYSTOSCOPY RIGHT URETEROSCOPY/HOLMIUM  LASER/STENT PLACEMENT;  Surgeon: Crista Elliot, MD;  Location: [facility];  Service: Urology;  Laterality: Right;  1 HR  FOR CASE   HERNIA REPAIR       A IV Location/Drains/Wounds Patient Lines/Drains/Airways Status     Active Line/Drains/Airways     Name Placement date Placement time Site Days   Peripheral IV 08/01/22 20 G Right Antecubital 08/01/22  2138  Antecubital  1   Urethral Catheter Fabien Travelstead, RN Latex 16 Fr. 08/02/22  0154  Latex  less than 1            Intake/Output Last 24 hours  Intake/Output Summary (Last 24 hours) at 08/02/2022 0159 Last data filed at 08/02/2022 0140 Gross per 24 hour  Intake 1200 ml  Output --  Net 1200 ml    Labs/Imaging Results for orders placed or performed during the hospital encounter of 08/01/22 (from the past 48 hour(s))  Basic metabolic panel     Status: Abnormal   Collection Time: 08/01/22  6:44 PM  Result Value Ref Range   Sodium 136 135 - 145 mmol/L   Potassium 3.9 3.5 - 5.1 mmol/L   Chloride 99 98 - 111 mmol/L   CO2 25 22 - 32 mmol/L   Glucose, Bld 187 (H) 70 - 99 mg/dL    Comment: Glucose reference range applies only to samples taken after fasting for at least 8 hours.   BUN 30 (H) 8 - 23 mg/dL   Creatinine, Ser 7.10 0.61 - 1.24 mg/dL   Calcium 8.6 (L) 8.9 - 10.3 mg/dL  GFR, Estimated >60 >60 mL/min    Comment: (NOTE) Calculated using the CKD-EPI Creatinine Equation (2021)    Anion gap 12 5 - 15    Comment: Performed at [facility], [address], [place], [place] [address]  CBC     Status: Abnormal   Collection Time: 08/01/22  6:44 PM  Result Value Ref Range   WBC 21.0 (H) 4.0 - 10.5 K/uL   RBC 4.56 4.22 - 5.81 MIL/uL   Hemoglobin 13.9 13.0 - 17.0 g/dL   HCT 60.4 54.0 - 98.1 %   MCV 92.8 80.0 - 100.0 fL   MCH 30.5 26.0 - 34.0 pg   MCHC 32.9 30.0 - 36.0 g/dL   RDW 19.1 47.8 - 29.5 %   Platelets 226 150 - 400 K/uL   nRBC 0.0 0.0 - 0.2 %    Comment: Performed at [facility], [address], [place], [place]  [address]  Lactic acid, plasma     Status: Abnormal   Collection Time: 08/01/22  6:44 PM  Result Value Ref Range   Lactic Acid, Venous 2.2 (HH) 0.5 - 1.9 mmol/L    Comment: CRITICAL RESULT CALLED TO, READ BACK BY AND VERIFIED WITH CARTER,C ON 08/01/22 AT 2015 BY LOY,C Performed at [facility], [address], [place], [place] [address]   Lactic acid, plasma     Status: None   Collection Time: 08/01/22  7:44 PM  Result Value Ref Range   Lactic Acid, Venous 1.7 0.5 - 1.9 mmol/L    Comment: Performed at [facility], [address] [place], [place] [address]  Culture, blood (single)     Status: None (Preliminary result)   Collection Time: 08/01/22  9:12 PM   Specimen: Right Antecubital; Blood  Result Value Ref Range   Specimen Description RIGHT ANTECUBITAL    Special Requests      BOTTLES DRAWN AEROBIC AND ANAEROBIC Blood Culture adequate volume Performed at [facility], [address], [place], [place] [address]    Culture PENDING    Report Status PENDING   Urinalysis, Routine w reflex microscopic -Urine, Catheterized; Indwelling urinary catheter     Status: Abnormal   Collection Time: 08/01/22 10:27 PM  Result Value Ref Range   Color, Urine AMBER (A) YELLOW    Comment: BIOCHEMICALS MAY BE AFFECTED BY COLOR   APPearance TURBID (A) CLEAR   Specific Gravity, Urine 1.015 1.005 - 1.030   pH 5.0 5.0 - 8.0   Glucose, UA NEGATIVE NEGATIVE mg/dL   Hgb urine dipstick LARGE (A) NEGATIVE   Bilirubin Urine NEGATIVE NEGATIVE   Ketones, ur 5 (A) NEGATIVE mg/dL   Protein, ur 132 (A) NEGATIVE mg/dL   Nitrite POSITIVE (A) NEGATIVE   Leukocytes,Ua LARGE (A) NEGATIVE   RBC / HPF >50 0 - 5 RBC/hpf   WBC, UA >50 0 - 5 WBC/hpf   Bacteria, UA MANY (A) NONE SEEN   Squamous Epithelial / HPF 0-5 0 - 5 /HPF   WBC Clumps PRESENT    Mucus PRESENT    Amorphous Crystal PRESENT     Comment: Performed at [facility], [address], [place], [place] [address]   CT Renal Stone Study  Result Date:  08/01/2022 CLINICAL DATA:  History of urolithiasis, with lithotripsy and stent placement last month, Foley catheter and right stenting still in place. Presents with suspected sepsis, with abdominal pain, fever, and vomiting as well as decreased urine output. EXAM: CT ABDOMEN AND PELVIS WITHOUT CONTRAST TECHNIQUE: Multidetector CT imaging of  the abdomen and pelvis was performed following the standard protocol without IV contrast. RADIATION DOSE REDUCTION: This exam was performed according to the departmental dose-optimization program which includes automated exposure control, adjustment of the mA and/or kV according to patient size and/or use of iterative reconstruction technique. COMPARISON:  CT without contrast 07/10/2022, CT with contrast 08/08/2020. FINDINGS: Lower chest: Scattered linear scar-like opacities in the lung bases. No acute process. Small hiatal hernia. The cardiac size is normal. Calcification in the right coronary artery again is noted. No pericardial fluid. Hepatobiliary: The liver is mildly steatotic with homogeneous attenuation. No masses seen without contrast. The gallbladder and bile ducts are unremarkable. Pancreas: Some images may suggest trace stranding along side the pancreatic neck and body but this is not confirmed in all imaging planes. Correlate clinically with serum lipase for possible pancreatitis. There is fatty infiltration of the pancreas as seen previously. No masses seen without contrast. Spleen: Unremarkable without contrast.  No splenomegaly. Adrenals/Urinary Tract: There is no adrenal mass. There previously was mild right hydroureteronephrosis above a 7 mm stone at L4-5. There has been interval right ureteral stenting with the upper loop in the renal pelvis and the lower loop in the bladder superiorly in the midline. The right ureter and collecting system have decompressed in the interval. At the L5 level there are 2 adjacent stones in the ureter posterior to the stent, 1  measuring 2 mm and the other measuring 3 mm, on 2:42. No other ureteral stones are seen. No stone debris is visible in the contracted bladder. The bladder wall is contracted and not well evaluated. The bladder is catheterized. Asymmetric right perinephric stranding edema is still seen and could relate to obstructive uropathy or inflammatory process but is unchanged since the last CT. There is trace right periureteric retroperitoneal fluid and stranding. Again noted are nonobstructive caliceal stones in the left lower pole up to 9 mm, with occasional punctate caliceal stones in the upper pole. No intrarenal stone is seen on the right. Stomach/Bowel: No bowel obstruction or inflammatory change. Normal appendix. Mild-to-moderate retained stool transverse and descending colon. Uncomplicated sigmoid diverticulosis. Vascular/Lymphatic: Aortic atherosclerosis. No enlarged abdominal or pelvic lymph nodes. Reproductive: Prostatomegaly, transverse prostate axis 4.8 cm. Other: Small inguinal fat hernias. As above trace right retroperitoneal fluid and periureteric stranding. No pelvic ascites. No free air, abscess or free hemorrhage. Musculoskeletal: Moderate to severe levoscoliosis apex L1-2. Degenerative changes lumbar spine and osteopenia. No spinal compression fractures or aggressive lesions. IMPRESSION: 1. Interval right ureteral stenting with 2 adjacent stones in the ureter at the L5 level, one measuring 2 mm and the other 3 mm. 2. Interval decompression of the right ureter and collecting system. 3. Asymmetric right perinephric as well as periureteral stranding edema is still seen and could relate to the now resolved obstructive uropathy or inflammatory process, but is unchanged since the last CT. 4. Nonobstructive left nephrolithiasis. 5. Constipation and diverticulosis. 6. Prostatomegaly. 7. Aortic and coronary artery atherosclerosis. 8. Query trace stranding at the pancreatic neck and body. Correlate clinically for  pancreatitis. Aortic Atherosclerosis (ICD10-I70.0). Electronically Signed   By: Almira Bar M.D.   On: 08/01/2022 23:13    Pending Labs Unresulted Labs (From admission, onward)    None       Vitals/Pain Today's Vitals   08/01/22 2332 08/01/22 2333 08/02/22 0052 08/02/22 0120  BP:  128/89 (!) 149/89   Pulse:  88 93   Resp:  19 17   Temp:   100 F (37.8 C)  100.3 F (37.9 C)  TempSrc:   Oral Oral  SpO2:  99% 97%   Weight:      Height:      PainSc: 8  8   3      Isolation Precautions No active isolations  Medications Medications  ondansetron (ZOFRAN) injection 4 mg (has no administration in time range)  lactated ringers infusion ( Intravenous New Bag/Given 08/01/22 2339)  ceFEPIme (MAXIPIME) 2 g in sodium chloride 0.9 % 100 mL IVPB (has no administration in time range)  sodium chloride 0.9 % bolus 1,000 mL (0 mLs Intravenous Stopped 08/01/22 2332)  ceFEPIme (MAXIPIME) 2 g in sodium chloride 0.9 % 100 mL IVPB (0 g Intravenous Stopped 08/01/22 2309)  ondansetron (ZOFRAN) injection 4 mg (4 mg Intravenous Given 08/01/22 2153)  morphine (PF) 4 MG/ML injection 4 mg (4 mg Intravenous Given 08/01/22 2153)  Ampicillin-Sulbactam (UNASYN) 3 g in sodium chloride 0.9 % 100 mL IVPB (0 g Intravenous Stopped 08/02/22 0140)  morphine (PF) 4 MG/ML injection 4 mg (4 mg Intravenous Given 08/02/22 0050)    Mobility non-ambulatory     Focused Assessments    R Recommendations: See Admitting Provider Note  Report given to:   Additional Notes: A&O; 20G RAC; 18F foley

## 2022-08-03 DIAGNOSIS — N401 Enlarged prostate with lower urinary tract symptoms: Secondary | ICD-10-CM

## 2022-08-03 DIAGNOSIS — A419 Sepsis, unspecified organism: Secondary | ICD-10-CM | POA: Diagnosis not present

## 2022-08-03 DIAGNOSIS — N390 Urinary tract infection, site not specified: Secondary | ICD-10-CM | POA: Diagnosis not present

## 2022-08-03 DIAGNOSIS — Z87442 Personal history of urinary calculi: Secondary | ICD-10-CM

## 2022-08-03 DIAGNOSIS — R339 Retention of urine, unspecified: Secondary | ICD-10-CM | POA: Diagnosis not present

## 2022-08-03 LAB — URINALYSIS, W/ REFLEX TO CULTURE (INFECTION SUSPECTED)
Bilirubin Urine: NEGATIVE
Glucose, UA: NEGATIVE mg/dL
Ketones, ur: 5 mg/dL — AB
Nitrite: POSITIVE — AB
Protein, ur: 30 mg/dL — AB
RBC / HPF: >50 RBC/hpf (ref 0–5)
Specific Gravity, Urine: 1.018 (ref 1.005–1.030)
WBC, UA: >50 WBC/hpf (ref 0–5)
pH: 5 (ref 5.0–8.0)

## 2022-08-03 LAB — GLUCOSE, CAPILLARY
Glucose-Capillary: 105 mg/dL — ABNORMAL HIGH (ref 70–99)
Glucose-Capillary: 146 mg/dL — ABNORMAL HIGH (ref 70–99)
Glucose-Capillary: 164 mg/dL — ABNORMAL HIGH (ref 70–99)
Glucose-Capillary: 132 mg/dL — ABNORMAL HIGH (ref 70–99)

## 2022-08-03 LAB — BASIC METABOLIC PANEL
Anion gap: 8 (ref 5–15)
BUN: 35 mg/dL — ABNORMAL HIGH (ref 8–23)
CO2: 25 mmol/L (ref 22–32)
Calcium: 8.1 mg/dL — ABNORMAL LOW (ref 8.9–10.3)
Chloride: 100 mmol/L (ref 98–111)
Creatinine, Ser: 1.19 mg/dL (ref 0.61–1.24)
GFR, Estimated: >60 mL/min (ref >60)
Glucose, Bld: 115 mg/dL — ABNORMAL HIGH (ref 70–99)
Potassium: 3.8 mmol/L (ref 3.5–5.1)
Sodium: 133 mmol/L — ABNORMAL LOW (ref 135–145)

## 2022-08-03 LAB — CBC
HCT: 36.7 % — ABNORMAL LOW (ref 39.0–52.0)
Hemoglobin: 12.3 g/dL — ABNORMAL LOW (ref 13.0–17.0)
MCH: 31.2 pg (ref 26.0–34.0)
MCHC: 33.5 g/dL (ref 30.0–36.0)
MCV: 93.1 fL (ref 80.0–100.0)
Platelets: 147 10*3/uL — ABNORMAL LOW (ref 150–400)
RBC: 3.94 MIL/uL — ABNORMAL LOW (ref 4.22–5.81)
RDW: 12.3 % (ref 11.5–15.5)
WBC: 13.4 10*3/uL — ABNORMAL HIGH (ref 4.0–10.5)
nRBC: 0 % (ref 0.0–0.2)

## 2022-08-03 LAB — MAGNESIUM: Magnesium: 2 mg/dL (ref 1.7–2.4)

## 2022-08-03 NOTE — Progress Notes (Signed)
[facility] PROGRESS NOTE  Leonard Mercer (DOB: [birth year]) ZOX:[id number] PCP: Richmond Campbell., PA-C Outpatient Specialists:   Brief Narrative: Leonard Mercer is a 67 y.o. male with a history of HTN, T2DM, HLD, PD, BPH, recent urolithiasis treated with lithotripsy, ureteral stent who presented to the ED on 08/01/2022 with sepsis due to Pseudomonas UTI and bacteremia. Improving on abx.   Subjective: Feels generally better, eating a bit more. No chest pain, dyspnea reported. No new palpitations. Feeling somewhat weak and nervous about his condition.   Objective: BP 133/80 (BP Location: Left Arm)   Pulse 82   Temp 99.6 F (37.6 C) (Oral)   Resp 18   Ht 5\' 11"  (1.803 m)   Wt 73.1 kg   SpO2 97%   BMI 22.48 kg/m   Gen: No distress Pulm: Clear, nonlabored  CV: RRR, no MRG or significant pitting edema GI: Soft, NT, ND, +BS. Foley in place, light yellow urine.  Neuro: Alert and oriented. +tremor. No new focal deficits. Ext: Warm, no deformities. Skin: No rashes, lesions or ulcers on visualized skin   Assessment & Plan: Severe sepsis due to CAUTI complicated by Pseudomonas bacteremia:  - Subjectively improving and WBC down. Still low grade fever. Will continue cefepime empirically pending susceptibility testing. Note bactrim allergy. If FQ sensitive, may d/w ID options for abx going forward.  -  Catheter exchanged 6/18. With hx lithotripsy 5/31 by Dr. Alvester Morin and ureteral stent, would need longer course of abx. Will need urology f/u.   Dehydration:  - Can DC IVF once taking adequate po.    Nephrolithiasis with lithotripsy Jul 15, 2022 with stent placement-performed by Dr. Alvester Morin.  CT scan showing improvement in obstructive pathology   T2DM: HbA1c 8.6%, uncontrolled hyperglycemia.  - Continue SSI   Hypomagnesemia: Resolved with supplementation   HTN:  - Continue ARB   HLD:  - Continue statin, zetia   Parkinson's disease with some mild memory impairment: Stable, sees Dr.  Frances Furbish.  - Continue routine sinemet, memantine - OOB ordered. On lovenox for VTE ppx, so will DC SCD order to facilitate mobility.  Depression:  - Continue SSRI, ordered per neurology   BPH:  - Continue flomax  Tyrone Nine, MD [facility] [email or web address] 08/03/2022, 8:54 AM

## 2022-08-03 NOTE — TOC Initial Note (Signed)
Transition of Care [facility]) - Initial/Assessment Note    Patient Details  Name: Leonard Mercer MRN: [id number] Date of Birth: 1955/09/30  Transition of Care [facility]) CM/SW Contact:    Annice Needy, LCSW Phone Number: 08/03/2022, 2:32 PM  Clinical Narrative:                 Patient from home with spouse and son. Admitted for sepsis secondary to UTI. Considered high risk for readmission. Independent with ADLs and ambulation at [place]. Drives. No current unmet needs identified.   Expected Discharge Plan: Home/Self Care Barriers to Discharge: Continued Medical Work up   Patient Goals and CMS Choice Patient states their goals for this hospitalization and ongoing recovery are:: return home          Expected Discharge Plan and Services       Living arrangements for the past 2 months: Single Family Home                                      Prior Living Arrangements/Services Living arrangements for the past 2 months: Single Family Home Lives with:: Adult Children, Spouse (wife and son) Patient language and need for interpreter reviewed:: Yes Do you feel safe going back to the place where you live?: Yes      Need for Family Participation in Patient Care: Yes (Comment) Care giver support system in place?: Yes (comment)   Criminal Activity/Legal Involvement Pertinent to Current Situation/Hospitalization: No - Comment as needed  Activities of Daily Living Home Assistive Devices/Equipment: Shower chair with back, Environmental consultant (specify type), Cane (specify quad or straight), Bedside commode/3-in-1 ADL Screening (condition at time of admission) Patient's cognitive ability adequate to safely complete daily activities?: No Is the patient deaf or have difficulty hearing?: No Does the patient have difficulty seeing, even when wearing glasses/contacts?: No Does the patient have difficulty concentrating, remembering, or making decisions?: Yes (Takes medications) Patient able to  express need for assistance with ADLs?: Yes Does the patient have difficulty dressing or bathing?: Yes Independently performs ADLs?: No Communication: Independent Dressing (OT): Needs assistance Is this a change from baseline?: Pre-admission baseline Grooming: Needs assistance Is this a change from baseline?: Pre-admission baseline Feeding: Independent Bathing: Needs assistance Is this a change from baseline?: Pre-admission baseline Toileting: Needs assistance Is this a change from baseline?: Pre-admission baseline In/Out Bed: Needs assistance Is this a change from baseline?: Pre-admission baseline Walks in Home: Needs assistance Is this a change from baseline?: Pre-admission baseline Does the patient have difficulty walking or climbing stairs?: Yes Weakness of Legs: None Weakness of Arms/Hands: Both  Permission Sought/Granted                  Emotional Assessment     Affect (typically observed): Appropriate Orientation: : Oriented to Self, Oriented to Place, Oriented to  Time, Oriented to Situation Alcohol / Substance Use: Not Applicable    Admission diagnosis:  Sepsis secondary to UTI (HCC) [A41.9, N39.0] Occlusion of ureteral stent, initial encounter (HCC) [T83.192A] Urinary tract infection with hematuria, site unspecified [N39.0, R31.9] Sepsis, due to unspecified organism, unspecified whether acute organ dysfunction present [facility]) [A41.9] Patient Active Problem List   Diagnosis Date Noted   Sepsis secondary to UTI (HCC) 08/02/2022   Lactic acidosis 08/02/2022   Nausea & vomiting 08/02/2022   Abdominal pain 08/02/2022   Dehydration 08/02/2022   Type 2 diabetes mellitus with hyperglycemia (HCC) 08/02/2022  Hypomagnesemia 08/02/2022   BPH (benign prostatic hyperplasia) 08/02/2022   Memory disorder 11/07/2018   Fatigue 11/26/2017   Parkinson disease 09/12/2016   Chronic right shoulder pain 05/26/2015   Tremor 05/26/2015   ED (erectile dysfunction) 05/20/2015    Mixed hyperlipidemia 05/20/2015   Diabetes (HCC) 05/02/2015   Essential hypertension 05/02/2015   Constipation 07/02/2012   Post-operative state 05/29/2012   PCP:  Richmond Campbell., PA-C Pharmacy:   [facility] [phone] - [place], [place] - [address] AT [facility] [address] & [address] [place] [address] Phone: [phone] Fax: [phone]  [facility] [address] [place] - [address] [place] [address] [place] [address] [place] [address] Phone: [phone] Fax: [phone]     Social Determinants of Health (SDOH) Social History: SDOH Screenings   Food Insecurity: No Food Insecurity (08/02/2022)  Housing: Low Risk  (08/02/2022)  Transportation Needs: No Transportation Needs (08/02/2022)  Utilities: Not At Risk (08/02/2022)  Tobacco Use: Low Risk  (08/01/2022)   SDOH Interventions:     Readmission Risk Interventions     No data to display

## 2022-08-03 NOTE — Progress Notes (Signed)
Patient has rested well. No acute issues during night and vitals were stable.  Lab called and stated that patient was positive for pseudo  aeruginosa.  Notified MD of lab results. No new orders received.  Patient stated he thinks he feels a little better this am. Output has been around 900cc this shift. (7p-7a).

## 2022-08-04 DIAGNOSIS — A419 Sepsis, unspecified organism: Secondary | ICD-10-CM | POA: Diagnosis not present

## 2022-08-04 DIAGNOSIS — N390 Urinary tract infection, site not specified: Secondary | ICD-10-CM | POA: Diagnosis not present

## 2022-08-04 LAB — GLUCOSE, CAPILLARY
Glucose-Capillary: 98 mg/dL (ref 70–99)
Glucose-Capillary: 207 mg/dL — ABNORMAL HIGH (ref 70–99)
Glucose-Capillary: 107 mg/dL — ABNORMAL HIGH (ref 70–99)
Glucose-Capillary: 138 mg/dL — ABNORMAL HIGH (ref 70–99)

## 2022-08-04 LAB — CULTURE, BLOOD (SINGLE): Special Requests: ADEQUATE

## 2022-08-04 NOTE — Progress Notes (Signed)
[facility] PROGRESS NOTE  BARRON BINNER (DOB: 05-Oct-1955) ZOX:[id number] PCP: Richmond Campbell., PA-C Outpatient Specialists:   Brief Narrative: Leonard Mercer is a 67 y.o. male with a history of HTN, T2DM, HLD, PD, BPH, recent urolithiasis treated with lithotripsy, ureteral stent who presented to the ED on 08/01/2022 with sepsis due to Pseudomonas UTI and bacteremia. Improving on abx.   Subjective: Still weaker than his baseline, no worse tremor. Felt chills a bit this morning with temp 99.63F. Doesn't feel ready to go home.    Objective: BP (!) 146/95 (BP Location: Right Arm)   Pulse 72   Temp 98.5 F (36.9 C) (Oral)   Resp 18   Ht 5\' 11"  (1.803 m)   Wt 73.1 kg   SpO2 96%   BMI 22.48 kg/m   Gen: No distress Pulm: Clear, nonlabored  CV: RRR, no MRG or edema GI: Soft, NT, ND, +BS. No CVA tenderness Neuro: Alert and oriented, stable BL UE tremor R > L. No new focal deficits. Ext: Warm, no deformities. Skin: No rashes, lesions or ulcers on visualized skin   Assessment & Plan: Severe sepsis due to CAUTI complicated by Pseudomonas bacteremia:  - Subjectively improving and WBC down. Again with low grade fever this morning, still feeling weak. Will continue cefepime empirically pending susceptibility testing (still pending). Note bactrim allergy. If FQ sensitive, may d/w ID options for abx going forward. Urology recommends 10-14 days. Given his complicated urinary tract, hardware, etc. we will maintain him as an inpatient with IV antibiotic pending further improvement and susceptibility data. -  Catheter exchanged 6/18. With hx lithotripsy 5/31 by Dr. Alvester Morin and ureteral stent. Appreciate Dr. Dimas Millin recommendation, will f/u for voiding trial after DC then follow up for ureteral stent removal.   Dehydration:  - Can DC IVF    Nephrolithiasis with lithotripsy Jul 15, 2022 with stent placement-performed by Dr. Alvester Morin.  CT scan showing improvement in obstructive pathology.  - F/u  with Dr. Alvester Morin in 2 weeks. There is mention of extraluminal stone.   T2DM: HbA1c 8.6%, uncontrolled hyperglycemia.  - Continue SSI   Hypomagnesemia: Resolved with supplementation   HTN:  - Continue ARB   HLD:  - Continue statin, zetia   Parkinson's disease with some mild memory impairment: Stable, sees Dr. Frances Furbish.  - Continue routine sinemet, memantine - OOB ordered. On lovenox for VTE ppx, so will DC SCD order to facilitate mobility.  Depression:  - Continue SSRI, ordered per neurology   BPH:  - Continue flomax  Tyrone Nine, MD [facility] [email or web address] 08/04/2022, 9:23 AM

## 2022-08-04 NOTE — Progress Notes (Signed)
Pharmacy Antibiotic Note  Leonard Mercer is a 67 y.o. male admitted on 08/01/2022 with N/V, fever, bilateral flank pain. Pt is s/p cystoscopy with stent placement on 5/31. Pharmacy has been consulted for cefepime dosing for bacteremia/UTI.   Plan: Cefepime 2g IV q8h Monitor renal function, culture data  Height: 5\' 11"  (180.3 cm) Weight: 73.1 kg (161 lb 2.5 oz) IBW/kg (Calculated) : 75.3  Temp (24hrs), Avg:98.8 F (37.1 C), Min:98.3 F (36.8 C), Max:99.5 F (37.5 C)  Recent Labs  Lab 08/01/22 1844 08/01/22 1944 08/02/22 0425 08/03/22 0423  WBC 21.0*  --  18.4* 13.4*  CREATININE 1.15  --  1.16 1.19  LATICACIDVEN 2.2* 1.7  --   --      Estimated Creatinine Clearance: 64.8 mL/min (by C-G formula based on SCr of 1.19 mg/dL).    Allergies  Allergen Reactions   Ace Inhibitors Other (See Comments)    unknown   Donepezil Hcl Other (See Comments)    Stomach upset Stomach upset   Sulfamethoxazole-Trimethoprim Nausea And Vomiting   Pravastatin Rash    Antimicrobials this admission: Cefepime 6/17 >>  Dose adjustments this admission:  Microbiology results: 6/17 BCx: pseudomonas 6/19 Ucx:  pending  Tad Moore, PharmD 08/04/2022 10:54 AM

## 2022-08-05 DIAGNOSIS — A4152 Sepsis due to Pseudomonas: Secondary | ICD-10-CM | POA: Diagnosis not present

## 2022-08-05 LAB — GLUCOSE, CAPILLARY
Glucose-Capillary: 99 mg/dL (ref 70–99)
Glucose-Capillary: 128 mg/dL — ABNORMAL HIGH (ref 70–99)
Glucose-Capillary: 135 mg/dL — ABNORMAL HIGH (ref 70–99)
Glucose-Capillary: 133 mg/dL — ABNORMAL HIGH (ref 70–99)

## 2022-08-05 LAB — CBC
HCT: 40.5 % (ref 39.0–52.0)
Hemoglobin: 13.9 g/dL (ref 13.0–17.0)
MCH: 31 pg (ref 26.0–34.0)
MCHC: 34.3 g/dL (ref 30.0–36.0)
MCV: 90.4 fL (ref 80.0–100.0)
Platelets: 191 10*3/uL (ref 150–400)
RBC: 4.48 MIL/uL (ref 4.22–5.81)
RDW: 12 % (ref 11.5–15.5)
WBC: 7.4 10*3/uL (ref 4.0–10.5)
nRBC: 0 % (ref 0.0–0.2)

## 2022-08-05 LAB — BASIC METABOLIC PANEL
Anion gap: 10 (ref 5–15)
BUN: 21 mg/dL (ref 8–23)
CO2: 28 mmol/L (ref 22–32)
Calcium: 8.8 mg/dL — ABNORMAL LOW (ref 8.9–10.3)
Chloride: 99 mmol/L (ref 98–111)
Creatinine, Ser: 1 mg/dL (ref 0.61–1.24)
GFR, Estimated: >60 mL/min (ref >60)
Glucose, Bld: 111 mg/dL — ABNORMAL HIGH (ref 70–99)
Potassium: 3.7 mmol/L (ref 3.5–5.1)
Sodium: 137 mmol/L (ref 135–145)

## 2022-08-05 LAB — URINE CULTURE: Culture: NO GROWTH

## 2022-08-05 MED ORDER — RISAQUAD PO CAPS
2.0000 | ORAL_CAPSULE | Freq: Three times a day (TID) | ORAL | Status: DC
  Administered 2022-08-05 – 2022-08-06 (×4): 2 via ORAL
  Filled 2022-08-05 (×4): qty 2

## 2022-08-05 MED ORDER — CIPROFLOXACIN HCL 250 MG PO TABS
500.0000 mg | ORAL_TABLET | Freq: Two times a day (BID) | ORAL | Status: DC
  Administered 2022-08-05 – 2022-08-06 (×2): 500 mg via ORAL
  Filled 2022-08-05 (×2): qty 2

## 2022-08-05 NOTE — TOC Progression Note (Addendum)
Transition of Care [facility]) - Progression Note    Patient Details  Name: Leonard Mercer MRN: [id number] Date of Birth: 1955-08-06  Transition of Care [facility] CM/SW Contact  Leitha Bleak, RN Phone Number: 08/05/2022, 2:13 PM  Clinical Narrative:   PT is recommending HHPT. Patient insurance will have a high copay. CM left his wife a message explaining that we can order outpatient PT if they wish. TOC following.   Wife called back he has therapy for her Parkinson disease that his insurance pays for, they will continue with this therapy.    Expected Discharge Plan: Home/Self Care Barriers to Discharge: No Home Care Agency will accept this patient  Expected Discharge Plan and Services      Living arrangements for the past 2 months: Single Family Home                   Social Determinants of Health (SDOH) Interventions SDOH Screenings   Food Insecurity: No Food Insecurity (08/02/2022)  Housing: Low Risk  (08/02/2022)  Transportation Needs: No Transportation Needs (08/02/2022)  Utilities: Not At Risk (08/02/2022)  Tobacco Use: Low Risk  (08/01/2022)    Readmission Risk Interventions     No data to display

## 2022-08-05 NOTE — Progress Notes (Signed)
PROGRESS NOTE    Patient: Leonard Mercer                            PCP: Richmond Campbell., PA-C                    DOB: [birth year]            DOA: 08/01/2022 ZDG:[id number]             DOS: 08/05/2022, 11:16 AM   LOS: 3 days   Date of Service: The patient was seen and examined on 08/05/2022  Subjective:   The patient was seen and examined this morning. Hemodynamically stable. No issues overnight .  Brief Narrative:   Leonard Mercer is a 67 y.o. male with a history of HTN, T2DM, HLD, PD, BPH, recent urolithiasis treated with lithotripsy, ureteral stent who presented to the ED on 08/01/2022 with sepsis due to Pseudomonas UTI and bacteremia. Improving on abx      Assessment & Plan:   Principal Problem:   Sepsis (HCC) Active Problems:   Parkinson disease   Mixed hyperlipidemia   Essential hypertension   Lactic acidosis   Nausea & vomiting   Abdominal pain   Dehydration   Type 2 diabetes mellitus with hyperglycemia (HCC)   Hypomagnesemia   BPH (benign prostatic hyperplasia)   Assessment & Plan:  Severe sepsis due to CAUTI complicated by Pseudomonas bacteremia:  - Subjectively improving and WBC down. Again with low grade fever this morning, still feeling weak. Will continue cefepime empirically pending susceptibility testing (still pending). Note bactrim allergy. If FQ sensitive, may d/w ID options for abx going forward. Urology recommends 10-14 days. Given his complicated urinary tract, hardware, etc. we will maintain him as an inpatient with IV antibiotic pending further improvement and susceptibility data. -  Catheter exchanged 6/18. With hx lithotripsy 5/31 by Dr. Alvester Morin and ureteral stent. Appreciate Dr. Dimas Millin recommendation, will f/u for voiding trial after DC then follow up for ureteral stent removal.    -Based on sensitivity blood culture Pseudomonas pansensitive, switching to p.o. ciprofloxacin -Repeating blood cultures today 08/05/2022 -if no growth in next 24  hours will continue p.o. ciprofloxacin  -Discussed with urologist Dr. Ronne Binning patient will be discharged on p.o. antibiotics and Foley catheter  Dehydration:  - S/p  IVF    Nephrolithiasis with lithotripsy Jul 15, 2022 with stent placement-performed by Dr. Alvester Morin.  CT scan showing improvement in obstructive pathology.  - F/u with Dr. Alvester Morin in 2 weeks. There is mention of extraluminal stone.   T2DM: HbA1c 8.6%, uncontrolled hyperglycemia.  - Continue SSI   Hypomagnesemia: Resolved with supplementation   HTN:  - Continue ARB   HLD:  - Continue statin, zetia   Parkinson's disease with some mild memory impairment: Stable, sees Dr. Frances Furbish.  - Continue routine sinemet, memantine - OOB ordered. On lovenox for VTE ppx, so will DC SCD order to facilitate mobility.   Depression:  - Continue SSRI, ordered per neurology   BPH:  - Continue flomax  ----------------------------------------------------------------------------------------------------------------------------------- Nutritional status:  The patient's BMI is: Body mass index is 22.48 kg/m. I agree with the assessment and plan as outlined below: Nutrition Status:      --------------------------------------------------------------------------------------------------------------------------------- Cultures; Blood Cultures x 2 >> Pseudomonas pansensitive Repeat blood cultures 08/05/2022 Urine Culture  >>> NGT     ------------------------------------------------------------------------------------------------------------------------------------------------  DVT prophylaxis:     Code Status:   Code Status: Full Code  Family Communication: Discussed with his wife at bedside 08/04/2022 The above findings and plan of care has been discussed with patient (and family)  in detail,  they expressed understanding and agreement of above. -Advance care planning has been discussed.   Admission status:   Status is:  Inpatient Remains inpatient appropriate because: Needing aggressive treatment for sepsis, bacteremia   Disposition: From  - home             Planning for discharge in 1-2 days: to   Procedures:   No admission procedures for hospital encounter.   Antimicrobials:  Anti-infectives (From admission, onward)    Start     Dose/Rate Route Frequency Ordered Stop   08/05/22 1800  ciprofloxacin (CIPRO) tablet 500 mg        500 mg Oral 2 times daily 08/05/22 0659     08/02/22 0600  ceFEPIme (MAXIPIME) 2 g in sodium chloride 0.9 % 100 mL IVPB  Status:  Discontinued        2 g 200 mL/hr over 30 Minutes Intravenous Every 8 hours 08/01/22 2127 08/05/22 0659   08/02/22 0030  Ampicillin-Sulbactam (UNASYN) 3 g in sodium chloride 0.9 % 100 mL IVPB        3 g 200 mL/hr over 30 Minutes Intravenous  Once 08/02/22 0025 08/02/22 0140   08/01/22 2115  cefTRIAXone (ROCEPHIN) 2 g in sodium chloride 0.9 % 100 mL IVPB  Status:  Discontinued        2 g 200 mL/hr over 30 Minutes Intravenous  Once 08/01/22 2111 08/01/22 2112   08/01/22 2115  ceFEPIme (MAXIPIME) 2 g in sodium chloride 0.9 % 100 mL IVPB        2 g 200 mL/hr over 30 Minutes Intravenous  Once 08/01/22 2112 08/01/22 2309        Medication:   acidophilus  2 capsule Oral TID   carbidopa-levodopa  1 tablet Oral TID   Chlorhexidine Gluconate Cloth  6 each Topical Q0600   ciprofloxacin  500 mg Oral BID   docusate sodium  100 mg Oral BID   escitalopram  10 mg Oral Daily   ezetimibe  10 mg Oral QPM   insulin aspart  0-9 Units Subcutaneous TID WC   losartan  50 mg Oral Q lunch   memantine  10 mg Oral BID   rosuvastatin  10 mg Oral QPM   tamsulosin  0.4 mg Oral QPC supper    acetaminophen **OR** acetaminophen, hydrALAZINE, ipratropium-albuterol, metoprolol tartrate, morphine injection, ondansetron **OR** ondansetron (ZOFRAN) IV, phenazopyridine, polyethylene glycol, senna-docusate, traZODone   Objective:   Vitals:   08/04/22 1329 08/04/22  2106 08/05/22 0417 08/05/22 0906  BP: 112/84 (!) 149/98 (!) 148/102 121/79  Pulse: 79 66 70 83  Resp:      Temp: 98.5 F (36.9 C) 98.2 F (36.8 C) 98.6 F (37 C)   TempSrc: Oral Oral Oral   SpO2: 97% 97% 97%   Weight:      Height:        Intake/Output Summary (Last 24 hours) at 08/05/2022 1116 Last data filed at 08/05/2022 0700 Gross per 24 hour  Intake 720 ml  Output 2550 ml  Net -1830 ml   Filed Weights   08/01/22 1755 08/02/22 0414  Weight: 72.6 kg 73.1 kg     Physical examination:   Constitution:  Alert, cooperative, no distress,  Appears calm and comfortable  Psychiatric:   Normal and stable mood and affect, cognition intact,   HEENT:  Normocephalic, PERRL, otherwise with in Normal limits  Chest:         Chest symmetric Cardio vascular:  S1/S2, RRR, No murmure, No Rubs or Gallops  pulmonary: Clear to auscultation bilaterally, respirations unlabored, negative wheezes / crackles Abdomen: Soft, non-tender, non-distended, bowel sounds,no masses, no organomegaly Muscular skeletal: Limited exam - in bed, able to move all 4 extremities,   Neuro: CNII-XII intact. , normal motor and sensation, reflexes intact  Extremities: No pitting edema lower extremities, +2 pulses  Skin: Dry, warm to touch, negative for any Rashes, No open wounds Wounds: per nursing documentation   ------------------------------------------------------------------------------------------------------------------------------------------    LABs:     Latest Ref Rng & Units 08/05/2022    7:28 AM 08/03/2022    4:23 AM 08/02/2022    4:25 AM  CBC  WBC 4.0 - 10.5 K/uL 7.4  13.4  18.4   Hemoglobin 13.0 - 17.0 g/dL 64.3  32.9  51.8   Hematocrit 39.0 - 52.0 % 40.5  36.7  38.0   Platelets 150 - 400 K/uL 191  147  169       Latest Ref Rng & Units 08/05/2022    7:28 AM 08/03/2022    4:23 AM 08/02/2022    4:25 AM  CMP  Glucose 70 - 99 mg/dL 841  660  630   BUN 8 - 23 mg/dL 21  35  30   Creatinine 0.61  - 1.24 mg/dL 1.60  1.09  3.23   Sodium 135 - 145 mmol/L 137  133  134   Potassium 3.5 - 5.1 mmol/L 3.7  3.8  3.6   Chloride 98 - 111 mmol/L 99  100  100   CO2 22 - 32 mmol/L 28  25  23    Calcium 8.9 - 10.3 mg/dL 8.8  8.1  8.0   Total Protein 6.5 - 8.1 g/dL   6.4   Total Bilirubin 0.3 - 1.2 mg/dL   3.7   Alkaline Phos 38 - 126 U/L   56   AST 15 - 41 U/L   14   ALT 0 - 44 U/L   17        Micro Results Recent Results (from the past 240 hour(s))  Culture, blood (single)     Status: Abnormal   Collection Time: 08/01/22  9:12 PM   Specimen: Right Antecubital; Blood  Result Value Ref Range Status   Specimen Description   Final    RIGHT ANTECUBITAL Performed at [facility], [address], [place], [place] [address]    Special Requests   Final    BOTTLES DRAWN AEROBIC AND ANAEROBIC Blood Culture adequate volume Performed at [facility], [address] [place], [place] [address]    Culture  Setup Time   Final    AEROBIC BOTTLE ONLY GRAM NEGATIVE RODS Gram Stain Report Called to,Read Back By and Verified With: Jeanmarie Hubert @ 1614 ON 08/02/2022 BY FRATTO,ASHLEY CRITICAL RESULT CALLED TO, READ BACK BY AND VERIFIED WITH: Gean Quint RN 08/02/22 @ 2248 BY AB Performed at [facility], [address], [place], [place] [address]    Culture PSEUDOMONAS AERUGINOSA (A)  Final   Report Status 08/04/2022 FINAL  Final   Organism ID, Bacteria PSEUDOMONAS AERUGINOSA  Final      Susceptibility   Pseudomonas aeruginosa - MIC*    CEFTAZIDIME 4 SENSITIVE Sensitive     CIPROFLOXACIN <=0.25 SENSITIVE Sensitive     GENTAMICIN <=1 SENSITIVE Sensitive     IMIPENEM 0.5  SENSITIVE Sensitive     PIP/TAZO <=4 SENSITIVE Sensitive     CEFEPIME 2 SENSITIVE Sensitive     * PSEUDOMONAS AERUGINOSA  Blood Culture ID Panel (Reflexed)     Status: Abnormal   Collection Time: 08/01/22  9:12 PM  Result Value Ref Range Status   Enterococcus faecalis NOT DETECTED NOT DETECTED Final   Enterococcus Faecium NOT  DETECTED NOT DETECTED Final   Listeria monocytogenes NOT DETECTED NOT DETECTED Final   Staphylococcus species NOT DETECTED NOT DETECTED Final   Staphylococcus aureus (BCID) NOT DETECTED NOT DETECTED Final   Staphylococcus epidermidis NOT DETECTED NOT DETECTED Final   Staphylococcus lugdunensis NOT DETECTED NOT DETECTED Final   Streptococcus species NOT DETECTED NOT DETECTED Final   Streptococcus agalactiae NOT DETECTED NOT DETECTED Final   Streptococcus pneumoniae NOT DETECTED NOT DETECTED Final   Streptococcus pyogenes NOT DETECTED NOT DETECTED Final   A.calcoaceticus-baumannii NOT DETECTED NOT DETECTED Final   Bacteroides fragilis NOT DETECTED NOT DETECTED Final   Enterobacterales NOT DETECTED NOT DETECTED Final   Enterobacter cloacae complex NOT DETECTED NOT DETECTED Final   Escherichia coli NOT DETECTED NOT DETECTED Final   Klebsiella aerogenes NOT DETECTED NOT DETECTED Final   Klebsiella oxytoca NOT DETECTED NOT DETECTED Final   Klebsiella pneumoniae NOT DETECTED NOT DETECTED Final   Proteus species NOT DETECTED NOT DETECTED Final   Salmonella species NOT DETECTED NOT DETECTED Final   Serratia marcescens NOT DETECTED NOT DETECTED Final   Haemophilus influenzae NOT DETECTED NOT DETECTED Final   Neisseria meningitidis NOT DETECTED NOT DETECTED Final   Pseudomonas aeruginosa DETECTED (A) NOT DETECTED Final    Comment: CRITICAL RESULT CALLED TO, READ BACK BY AND VERIFIED WITH: BLaural Benes RN 08/02/22 @ 2248 BY AB    Stenotrophomonas maltophilia NOT DETECTED NOT DETECTED Final   Candida albicans NOT DETECTED NOT DETECTED Final   Candida auris NOT DETECTED NOT DETECTED Final   Candida glabrata NOT DETECTED NOT DETECTED Final   Candida krusei NOT DETECTED NOT DETECTED Final   Candida parapsilosis NOT DETECTED NOT DETECTED Final   Candida tropicalis NOT DETECTED NOT DETECTED Final   Cryptococcus neoformans/gattii NOT DETECTED NOT DETECTED Final   CTX-M ESBL NOT DETECTED NOT DETECTED  Final   Carbapenem resistance IMP NOT DETECTED NOT DETECTED Final   Carbapenem resistance KPC NOT DETECTED NOT DETECTED Final   Carbapenem resistance NDM NOT DETECTED NOT DETECTED Final   Carbapenem resistance VIM NOT DETECTED NOT DETECTED Final    Comment: Performed at [facility], [address] [place], [place] [address]  Urine Culture (for pregnant, neutropenic or urologic patients or patients with an indwelling urinary catheter)     Status: None   Collection Time: 08/03/22  6:00 PM   Specimen: Urine, Clean Catch  Result Value Ref Range Status   Specimen Description   Final    URINE, CLEAN CATCH Performed at [facility], [address] [place], [place] [address]    Special Requests   Final    NONE Performed at [facility], [address] [place], [place] [address]    Culture   Final    NO GROWTH Performed at [facility], [address], [place], [place] [address]    Report Status 08/05/2022 FINAL  Final    Radiology Reports No results found.  SIGNED: Kendell Bane, MD, FHM. FAAFP. [facility] - [facility] Time spent - 35 min.  In seeing, evaluating and examining the patient. Reviewing medical records, labs,  drawn plan of care. [facility],  Pager (please use [email or web address] to page/ text) Please use Epic Secure Chat for non-urgent communication (7AM-7PM)  If 7PM-7AM, please contact night-coverage [email or web address], 08/05/2022, 11:16 AM

## 2022-08-05 NOTE — Evaluation (Signed)
Physical Therapy Evaluation Patient Details Name: Leonard Mercer MRN: [id number] DOB: April 04, 1955 Today's Date: 08/05/2022  History of Present Illness  Leonard Mercer is a 67 y.o. male with medical history significant of hypertension, type 2 diabetes mellitus, hyperlipidemia, Parkinson's disease, BPH who presents to the emergency department due to fever.  Patient had ureteral lithiasis with lithotripsy on 07/14/2022 with stent placement and Foley catheter placement.  He complained of intermittent burning sensation at the urethra and now complained of fever yesterday and this was associated with chills, nausea and vomiting as well as suprapubic pain.  He denies chest pain, shortness of breath, headache.    Clinical Impression  Patient lying in bed on therapist arrival.  Wife at bedside; patient agreeable to therapist assessment.  Patient performs supine to sit with HOB elevated modified independently; taking extra time to perform.  Once sitting he his able to sit on the edge of the bed without assistance.  Patient needs min A with initial boost for sit to stand to RW from edge of bed .  Once standing; he needs only min guard assist for safety and is able to ambulate in room and hallway x 100 ft with RW and CGA with noted shuffling gait and small base of support; decreased overall gait speed noted.  He has to take 2 short standing rest breaks during walk.  Patient reports significant fatigue after walk.  He returns to bed without SBA.  Patient left in bed with bed alarm set and nurse call button in reach; wife at bedside.  Patient will benefit from continued skilled therapy services during the remainder of his hospital stay and at the next recommended venue of care to address deficits and promote return to optimal function.         Recommendations for follow up therapy are one component of a multi-disciplinary discharge planning process, led by the attending physician.  Recommendations may be updated based  on patient status, additional functional criteria and insurance authorization.  Follow Up Recommendations       Assistance Recommended at Discharge Intermittent Supervision/Assistance  Patient can return home with the following  A little help with walking and/or transfers;Help with stairs or ramp for entrance;A little help with bathing/dressing/bathroom    Equipment Recommendations None recommended by PT  Recommendations for Other Services       Functional Status Assessment Patient has had a recent decline in their functional status and demonstrates the ability to make significant improvements in function in a reasonable and predictable amount of time.     Precautions / Restrictions Precautions Precautions: Fall Restrictions Weight Bearing Restrictions: No      Mobility  Bed Mobility Overal bed mobility: Modified Independent             General bed mobility comments: takes extra time for supine to sit with HOB elevated Patient Response: Cooperative  Transfers Overall transfer level: Needs assistance Equipment used: Rolling walker (2 wheels) Transfers: Sit to/from Stand Sit to Stand: Min assist           General transfer comment: min assist for initial boost up from sitting    Ambulation/Gait Ambulation/Gait assistance: Min guard Gait Distance (Feet): 100 Feet Assistive device: Rolling walker (2 wheels) Gait Pattern/deviations: Narrow base of support, Shuffle       General Gait Details: small shuffling steps; slower than normal gait speed; takes 2 short standing rest breaks  Careers information officer  Modified Rankin (Stroke Patients Only)       Balance Overall balance assessment: Needs assistance Sitting-balance support: Feet supported, Bilateral upper extremity supported Sitting balance-Leahy Scale: Good Sitting balance - Comments: good sitting balance on edge of bed   Standing balance support: Bilateral upper extremity  supported, Reliant on assistive device for balance, During functional activity Standing balance-Leahy Scale: Fair Standing balance comment: fair to good standing balance with RW; walks and stands with small base of support and shuffling gait pattern                             Pertinent Vitals/Pain Pain Assessment Pain Assessment: No/denies pain    Home Living Family/patient expects to be discharged to:: Private residence Living Arrangements: Children;Spouse/significant other Available Help at Discharge: Available PRN/intermittently;Family Type of Home: House Home Access: Stairs to enter;Ramped entrance   Entrance Stairs-Number of Steps: 4 Alternate Level Stairs-Number of Steps: 15 Home Layout: Two level;Able to live on main level with bedroom/bathroom Home Equipment: BSC/3in1;Hand held shower head;Rolling Walker (2 wheels);Shower seat;Wheelchair - manual;Cane - single point;Grab bars - toilet;Grab bars - tub/shower      Prior Function Prior Level of Function : Independent/Modified Independent                     Hand Dominance   Dominant Hand: Right    Extremity/Trunk Assessment   Upper Extremity Assessment Upper Extremity Assessment: Generalized weakness    Lower Extremity Assessment Lower Extremity Assessment: Generalized weakness    Cervical / Trunk Assessment Cervical / Trunk Assessment: Normal  Communication   Communication: No difficulties  Cognition Arousal/Alertness: Awake/alert Behavior During Therapy: WFL for tasks assessed/performed Overall Cognitive Status: Within Functional Limits for tasks assessed                                          General Comments      Exercises     Assessment/Plan    PT Assessment Patient needs continued PT services  PT Problem List Decreased strength;Decreased activity tolerance;Decreased balance;Decreased mobility       PT Treatment Interventions Balance training;Gait  training;Functional mobility training;Therapeutic activities;Therapeutic exercise;Patient/family education    PT Goals (Current goals can be found in the Care Plan section)  Acute Rehab PT Goals Patient Stated Goal: return home PT Goal Formulation: With patient/family Time For Goal Achievement: 08/19/22 Potential to Achieve Goals: Good    Frequency Min 2X/week     Co-evaluation               AM-PAC PT "6 Clicks" Mobility  Outcome Measure Help needed turning from your back to your side while in a flat bed without using bedrails?: None Help needed moving from lying on your back to sitting on the side of a flat bed without using bedrails?: None Help needed moving to and from a bed to a chair (including a wheelchair)?: A Little Help needed standing up from a chair using your arms (e.g., wheelchair or bedside chair)?: A Little Help needed to walk in hospital room?: A Little Help needed climbing 3-5 steps with a railing? : A Lot 6 Click Score: 19    End of Session   Activity Tolerance: Patient tolerated treatment well Patient left: in bed;with family/visitor present;with call bell/phone within reach;with bed alarm set Nurse Communication: Mobility status PT Visit Diagnosis:  Other abnormalities of gait and mobility (R26.89);Muscle weakness (generalized) (M62.81)    Time: 1308-6578 PT Time Calculation (min) (ACUTE ONLY): 25 min   Charges:   PT Evaluation $PT Eval Low Complexity: 1 Low          8:37 AM, 08/05/22 Ziomara Birenbaum Small Joseth Weigel MPT [facility] [place] [phone] Ph:[phone]

## 2022-08-05 NOTE — Plan of Care (Signed)
  Problem: Acute Rehab PT Goals(only PT should resolve) Goal: Pt Will Go Supine/Side To Sit Outcome: Progressing Flowsheets (Taken 08/05/2022 0838) Pt will go Supine/Side to Sit: Independently Goal: Patient Will Transfer Sit To/From Stand Outcome: Progressing Flowsheets (Taken 08/05/2022 [phone]) Patient will transfer sit to/from stand: with min guard assist Goal: Pt Will Transfer Bed To Chair/Chair To Bed Outcome: Progressing Flowsheets (Taken 08/05/2022 0838) Pt will Transfer Bed to Chair/Chair to Bed: with supervision Goal: Pt Will Ambulate Outcome: Progressing Flowsheets (Taken 08/05/2022 0838) Pt will Ambulate:  > 125 feet  with least restrictive assistive device  with supervision

## 2022-08-05 NOTE — Hospital Course (Addendum)
Leonard Mercer is a 67 y.o. male with a history of HTN, T2DM, HLD, PD, BPH, recent urolithiasis treated with lithotripsy, ureteral stent who presented to the ED on 08/01/2022 with sepsis due to Pseudomonas UTI and bacteremia. Improving on abx      Assessment & Plan:   Principal Problem:   Sepsis (HCC) Active Problems:   Parkinson disease   Mixed hyperlipidemia   Essential hypertension   Lactic acidosis   Nausea & vomiting   Abdominal pain   Dehydration   Type 2 diabetes mellitus with hyperglycemia (HCC)   Hypomagnesemia   BPH (benign prostatic hyperplasia)   Assessment & Plan:  Severe sepsis due to CAUTI complicated by Pseudomonas bacteremia:  - Subjectively improving and WBC down. Again with low grade fever this morning, still feeling weak. Will continue cefepime empirically pending susceptibility testing (still pending). Note bactrim allergy. If FQ sensitive, may d/w ID options for abx going forward. Urology recommends 10-14 days. Given his complicated urinary tract, hardware, etc. we will maintain him as an inpatient with IV antibiotic pending further improvement and susceptibility data. -  Catheter exchanged 6/18. With hx lithotripsy 5/31 by Dr. Alvester Morin and ureteral stent. Appreciate Dr. Dimas Millin recommendation, will f/u for voiding trial after DC then follow up for ureteral stent removal.    -Based on sensitivity blood culture Pseudomonas pansensitive, switching to p.o. ciprofloxacin -Repeating blood cultures today 08/05/2022 -if no growth in next 24 hours will continue p.o. ciprofloxacin  -Discussed with urologist Dr. Ronne Binning patient will be discharged on p.o. antibiotics and Foley catheter  Dehydration:  - S/p  IVF    Nephrolithiasis with lithotripsy Jul 15, 2022 with stent placement-performed by Dr. Alvester Morin.  CT scan showing improvement in obstructive pathology.  - F/u with Dr. Alvester Morin in 2 weeks. There is mention of extraluminal stone.   T2DM: HbA1c 8.6%, uncontrolled  hyperglycemia.  - Continue SSI   Hypomagnesemia: Resolved with supplementation   HTN:  - Continue ARB   HLD:  - Continue statin, zetia   Parkinson's disease with some mild memory impairment: Stable, sees Dr. Frances Furbish.  - Continue routine sinemet, memantine - OOB ordered. On lovenox for VTE ppx, so will DC SCD order to facilitate mobility.   Depression:  - Continue SSRI, ordered per neurology   BPH:  - Continue flomax

## 2022-08-06 DIAGNOSIS — A4152 Sepsis due to Pseudomonas: Secondary | ICD-10-CM | POA: Diagnosis not present

## 2022-08-06 LAB — BASIC METABOLIC PANEL
Anion gap: 9 (ref 5–15)
BUN: 21 mg/dL (ref 8–23)
CO2: 27 mmol/L (ref 22–32)
Calcium: 8.8 mg/dL — ABNORMAL LOW (ref 8.9–10.3)
Chloride: 99 mmol/L (ref 98–111)
Creatinine, Ser: 1.06 mg/dL (ref 0.61–1.24)
GFR, Estimated: >60 mL/min (ref >60)
Glucose, Bld: 110 mg/dL — ABNORMAL HIGH (ref 70–99)
Potassium: 3.8 mmol/L (ref 3.5–5.1)
Sodium: 135 mmol/L (ref 135–145)

## 2022-08-06 LAB — CBC
HCT: 41 % (ref 39.0–52.0)
Hemoglobin: 13.6 g/dL (ref 13.0–17.0)
MCH: 30.2 pg (ref 26.0–34.0)
MCHC: 33.2 g/dL (ref 30.0–36.0)
MCV: 91.1 fL (ref 80.0–100.0)
Platelets: 198 10*3/uL (ref 150–400)
RBC: 4.5 MIL/uL (ref 4.22–5.81)
RDW: 11.9 % (ref 11.5–15.5)
WBC: 8 10*3/uL (ref 4.0–10.5)
nRBC: 0 % (ref 0.0–0.2)

## 2022-08-06 LAB — GLUCOSE, CAPILLARY
Glucose-Capillary: 100 mg/dL — ABNORMAL HIGH (ref 70–99)
Glucose-Capillary: 205 mg/dL — ABNORMAL HIGH (ref 70–99)

## 2022-08-06 LAB — CULTURE, BLOOD (ROUTINE X 2): Culture: NO GROWTH

## 2022-08-06 MED ORDER — CIPROFLOXACIN HCL 500 MG PO TABS: 500.0000 mg | ORAL_TABLET | Freq: Two times a day (BID) | ORAL | 0 refills | Status: AC

## 2022-08-06 MED ORDER — RISAQUAD PO CAPS: 2.0000 | ORAL_CAPSULE | Freq: Three times a day (TID) | ORAL | 0 refills | Status: AC

## 2022-08-06 NOTE — Discharge Summary (Signed)
Physician Discharge Summary   Patient: Leonard Mercer MRN: [id number] DOB: 07/14/1955  Admit date:     08/01/2022  Discharge date: 08/06/22  Discharge Physician: Kendell Bane   PCP: Richmond Campbell., PA-C   Recommendations at discharge:    Follow-up with PCP in 1 week.  Follow-up with your primary urologist within 1- weeks, Continue current antibiotics, continue Foley catheter until seen in office and discontinue Please follow-up with the final repeat blood culture with PCP or urologist within 1 week  Discharge Diagnoses: Principal Problem:   Sepsis (HCC) Active Problems:   Parkinson disease   Mixed hyperlipidemia   Essential hypertension   Lactic acidosis   Nausea & vomiting   Abdominal pain   Dehydration   Type 2 diabetes mellitus with hyperglycemia (HCC)   Hypomagnesemia   BPH (benign prostatic hyperplasia)   Severe sepsis due to CAUTI complicated by Pseudomonas bacteremia:  - Subjectively improving and WBC down. Again with low grade fever this morning, still feeling weak. Will continue cefepime empirically pending susceptibility testing (still pending). Note bactrim allergy. If FQ sensitive, may d/w ID options for abx going forward. Urology recommends 10-14 days. Given his complicated urinary tract, hardware, etc. we will maintain him as an inpatient with IV antibiotic pending further improvement and susceptibility data.  -  Catheter exchanged 6/18. With hx lithotripsy 5/31 by Dr. Alvester Morin and ureteral stent.  Appreciate Dr. Dimas Millin recommendation, will f/u for voiding trial after DC then follow up for ureteral stent removal.    -Based on sensitivity blood culture Pseudomonas pansensitive, switching to p.o. ciprofloxacin -Repeating blood cultures today 08/05/2022 -no growth in next 24 hours will continue p.o. ciprofloxacin   -Discussed with urologist Dr. Ronne Binning patient will be discharged on p.o. antibiotics and Foley catheter   Dehydration:  - S/p  IVF     Nephrolithiasis with lithotripsy Jul 15, 2022 with stent placement-performed by Dr. Alvester Morin.  CT scan showing improvement in obstructive pathology.  - F/u with Dr. Alvester Morin in 2 weeks. There is mention of extraluminal stone.   T2DM: HbA1c 8.6%, uncontrolled hyperglycemia.     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 Frances Furbish.  - Continue routine sinemet, memantine - OOB ordered. On lovenox for VTE ppx, so will DC SCD order to facilitate mobility.   Depression: - Continue SSRI, ordered per neurology   BPH: - Continue flomax   ----------------------------------------------------------------------------------------------------------------------------------- Nutritional status:  The patient's BMI is: Body mass index is 22.48 kg/m. I agree with the assessment and plan as outlined below: Nutrition Status:  --------------------------------------------------------------------------------------------------------------------------------- Cultures; Blood Cultures x 2 >> Pseudomonas - pansensitive Repeat blood cultures 08/05/2022 Urine Culture  >>> NGT    Antibiotics Cefepime >> p.o. ciprofloxacin    Consultants: urologist  Procedures performed: Foley catheter Disposition: Home Diet recommendation:  Discharge Diet Orders (From admission, onward)     Start     Ordered   08/06/22 0000  Diet - low sodium heart healthy        08/06/22 0739           Regular diet DISCHARGE MEDICATION: Allergies as of 08/06/2022       Reactions   Ace Inhibitors Other (See Comments)   unknown   Donepezil Hcl Other (See Comments)   Stomach upset Stomach upset   Sulfamethoxazole-trimethoprim Nausea And Vomiting   Pravastatin Rash  Medication List     TAKE these medications    acidophilus Caps capsule Take 2 capsules by mouth 3 (three) times daily for 7  days.   carbidopa-levodopa 50-200 MG tablet Commonly known as: SINEMET CR Take 1 tablet by mouth 3 (three) times daily.   ciprofloxacin 500 MG tablet Commonly known as: CIPRO Take 1 tablet (500 mg total) by mouth 2 (two) times daily for 7 days.   escitalopram 10 MG tablet Commonly known as: Lexapro Take 1 tablet (10 mg total) by mouth daily.   ezetimibe 10 MG tablet Commonly known as: ZETIA Take 10 mg by mouth every evening.   losartan 50 MG tablet Commonly known as: COZAAR Take 50 mg by mouth daily with lunch.   Melatonin 10 MG Tabs Take 10 mg by mouth at bedtime as needed (sleep.).   memantine 10 MG tablet Commonly known as: Namenda Take 1 tablet (10 mg total) by mouth 2 (two) times daily.   metFORMIN 1000 MG tablet Commonly known as: GLUCOPHAGE Take 1,000 mg by mouth 2 (two) times daily with a meal.   ondansetron 4 MG tablet Commonly known as: ZOFRAN Take 1 tablet (4 mg total) by mouth every 8 (eight) hours as needed for nausea or vomiting. What changed: when to take this   oxyCODONE-acetaminophen 5-325 MG tablet Commonly known as: PERCOCET/ROXICET Take 1 tablet by mouth every 6 (six) hours as needed for severe pain.   phenazopyridine 200 MG tablet Commonly known as: PYRIDIUM Take 1 tablet (200 mg total) by mouth 3 (three) times daily as needed for pain.   polyethylene glycol 17 g packet Commonly known as: MIRALAX / GLYCOLAX Take 17 g by mouth daily as needed (constipation.).   rosuvastatin 10 MG tablet Commonly known as: CRESTOR Take 10 mg by mouth every evening.   tamsulosin 0.4 MG Caps capsule Commonly known as: FLOMAX Take 1 capsule (0.4 mg total) by mouth daily after supper.        Discharge Exam: Filed Weights   08/01/22 1755 08/02/22 0414  Weight: 72.6 kg 73.1 kg        General:  AAO x 3,  cooperative, no distress;   HEENT:  Normocephalic, PERRL, otherwise with in Normal limits   Neuro:  CNII-XII intact. , normal motor and sensation,  reflexes intact   Lungs:   Clear to auscultation BL, Respirations unlabored,  No wheezes / crackles  Cardio:    S1/S2, RRR, No murmure, No Rubs or Gallops   Abdomen:  Soft, non-tender, bowel sounds active all four quadrants, no guarding or peritoneal signs.  Muscular  skeletal:  Limited exam -global generalized weaknesses - in bed, able to move all 4 extremities,   2+ pulses,  symmetric, No pitting edema  Skin:  Dry, warm to touch, negative for any Rashes,  + Folay cath      Condition at discharge: fair  The results of significant diagnostics from this hospitalization (including imaging, microbiology, ancillary and laboratory) are listed below for reference.   Imaging Studies: CT Renal Stone Study  Result Date: 08/01/2022 CLINICAL DATA:  History of urolithiasis, with lithotripsy and stent placement last month, Foley catheter and right stenting still in place. Presents with suspected sepsis, with abdominal pain, fever, and vomiting as well as decreased urine output. EXAM: CT ABDOMEN AND PELVIS WITHOUT CONTRAST TECHNIQUE: Multidetector CT imaging of the abdomen and pelvis was performed following the standard protocol without IV contrast. RADIATION DOSE REDUCTION: This exam was performed according to the departmental dose-optimization  program which includes automated exposure control, adjustment of the mA and/or kV according to patient size and/or use of iterative reconstruction technique. COMPARISON:  CT without contrast 07/10/2022, CT with contrast 08/08/2020. FINDINGS: Lower chest: Scattered linear scar-like opacities in the lung bases. No acute process. Small hiatal hernia. The cardiac size is normal. Calcification in the right coronary artery again is noted. No pericardial fluid. Hepatobiliary: The liver is mildly steatotic with homogeneous attenuation. No masses seen without contrast. The gallbladder and bile ducts are unremarkable. Pancreas: Some images may suggest trace stranding along side  the pancreatic neck and body but this is not confirmed in all imaging planes. Correlate clinically with serum lipase for possible pancreatitis. There is fatty infiltration of the pancreas as seen previously. No masses seen without contrast. Spleen: Unremarkable without contrast.  No splenomegaly. Adrenals/Urinary Tract: There is no adrenal mass. There previously was mild right hydroureteronephrosis above a 7 mm stone at L4-5. There has been interval right ureteral stenting with the upper loop in the renal pelvis and the lower loop in the bladder superiorly in the midline. The right ureter and collecting system have decompressed in the interval. At the L5 level there are 2 adjacent stones in the ureter posterior to the stent, 1 measuring 2 mm and the other measuring 3 mm, on 2:42. No other ureteral stones are seen. No stone debris is visible in the contracted bladder. The bladder wall is contracted and not well evaluated. The bladder is catheterized. Asymmetric right perinephric stranding edema is still seen and could relate to obstructive uropathy or inflammatory process but is unchanged since the last CT. There is trace right periureteric retroperitoneal fluid and stranding. Again noted are nonobstructive caliceal stones in the left lower pole up to 9 mm, with occasional punctate caliceal stones in the upper pole. No intrarenal stone is seen on the right. Stomach/Bowel: No bowel obstruction or inflammatory change. Normal appendix. Mild-to-moderate retained stool transverse and descending colon. Uncomplicated sigmoid diverticulosis. Vascular/Lymphatic: Aortic atherosclerosis. No enlarged abdominal or pelvic lymph nodes. Reproductive: Prostatomegaly, transverse prostate axis 4.8 cm. Other: Small inguinal fat hernias. As above trace right retroperitoneal fluid and periureteric stranding. No pelvic ascites. No free air, abscess or free hemorrhage. Musculoskeletal: Moderate to severe levoscoliosis apex L1-2.  Degenerative changes lumbar spine and osteopenia. No spinal compression fractures or aggressive lesions. IMPRESSION: 1. Interval right ureteral stenting with 2 adjacent stones in the ureter at the L5 level, one measuring 2 mm and the other 3 mm. 2. Interval decompression of the right ureter and collecting system. 3. Asymmetric right perinephric as well as periureteral stranding edema is still seen and could relate to the now resolved obstructive uropathy or inflammatory process, but is unchanged since the last CT. 4. Nonobstructive left nephrolithiasis. 5. Constipation and diverticulosis. 6. Prostatomegaly. 7. Aortic and coronary artery atherosclerosis. 8. Query trace stranding at the pancreatic neck and body. Correlate clinically for pancreatitis. Aortic Atherosclerosis (ICD10-I70.0). Electronically Signed   By: Almira Bar M.D.   On: 08/01/2022 23:13   DG Abdomen 1 View  Result Date: 07/16/2022 CLINICAL DATA:  Stent placement EXAM: ABDOMEN - 1 VIEW COMPARISON:  CT renal stone 07/10/2022 FINDINGS: Right ureteral stent in place. Left renal calculus measuring 8 mm appears unchanged. Bowel-gas pattern is nonobstructive. There is a large amount of stool throughout the colon. There is levoconvex scoliosis of the lumbar spine. IMPRESSION: 1. Right ureteral stent in place. 2. Unchanged left renal calculus. 3. Large amount of stool throughout the colon. Electronically Signed  By: Darliss Cheney M.D.   On: 07/16/2022 23:57   DG C-Arm 1-60 Min-No Report  Result Date: 07/15/2022 Fluoroscopy was utilized by the requesting physician.  No radiographic interpretation.   DG C-Arm 1-60 Min-No Report  Result Date: 07/15/2022 Fluoroscopy was utilized by the requesting physician.  No radiographic interpretation.   CT Renal Stone Study  Result Date: 07/10/2022 CLINICAL DATA:  Abdominal/flank pain with stone suspected EXAM: CT ABDOMEN AND PELVIS WITHOUT CONTRAST TECHNIQUE: Multidetector CT imaging of the abdomen and  pelvis was performed following the standard protocol without IV contrast. RADIATION DOSE REDUCTION: This exam was performed according to the departmental dose-optimization program which includes automated exposure control, adjustment of the mA and/or kV according to patient size and/or use of iterative reconstruction technique. COMPARISON:  08/08/2020 FINDINGS: Lower chest:  RCA atheromatous calcification. Hepatobiliary: No focal liver abnormality.No evidence of biliary obstruction or stone. Pancreas: Generalized atrophy and fatty infiltration Spleen: Unremarkable. Adrenals/Urinary Tract: Negative adrenals. Mild right hydronephrosis and upper hydroureter due to a 7 mm stone in the ureter at the level of L4-5. Three left renal calculi measuring up to 9 mm. Unremarkable bladder. Stomach/Bowel: No obstruction. No visible bowel inflammation. Distal colonic diverticulosis. Vascular/Lymphatic: No acute vascular abnormality. No mass or adenopathy. Reproductive:No pathologic findings. Other: No ascites or pneumoperitoneum. Musculoskeletal: No acute abnormalities. Lumbar spine degeneration and scoliosis. IMPRESSION: 1. Obstructing right mid ureteral calculus which measures 7 mm. 2. Multiple left renal calculi. 3. Colonic diverticulosis. Electronically Signed   By: Tiburcio Pea M.D.   On: 07/10/2022 08:44    Microbiology: Results for orders placed or performed during the hospital encounter of 08/01/22  Culture, blood (single)     Status: Abnormal   Collection Time: 08/01/22  9:12 PM   Specimen: Right Antecubital; Blood  Result Value Ref Range Status   Specimen Description   Final    RIGHT ANTECUBITAL Performed at [facility], [address], [place], [place] [address]    Special Requests   Final    BOTTLES DRAWN AEROBIC AND ANAEROBIC Blood Culture adequate volume Performed at [facility], [address], [place], [place] [address]    Culture  Setup Time   Final    AEROBIC BOTTLE ONLY GRAM NEGATIVE  RODS Gram Stain Report Called to,Read Back By and Verified With: Jeanmarie Hubert @ 1614 ON 08/02/2022 BY FRATTO,ASHLEY CRITICAL RESULT CALLED TO, READ BACK BY AND VERIFIED WITH: Gean Quint RN 08/02/22 @ 2248 BY AB Performed at [facility], [address], [place], [place] [address]    Culture PSEUDOMONAS AERUGINOSA (A)  Final   Report Status 08/04/2022 FINAL  Final   Organism ID, Bacteria PSEUDOMONAS AERUGINOSA  Final      Susceptibility   Pseudomonas aeruginosa - MIC*    CEFTAZIDIME 4 SENSITIVE Sensitive     CIPROFLOXACIN <=0.25 SENSITIVE Sensitive     GENTAMICIN <=1 SENSITIVE Sensitive     IMIPENEM 0.5 SENSITIVE Sensitive     PIP/TAZO <=4 SENSITIVE Sensitive     CEFEPIME 2 SENSITIVE Sensitive     * PSEUDOMONAS AERUGINOSA  Blood Culture ID Panel (Reflexed)     Status: Abnormal   Collection Time: 08/01/22  9:12 PM  Result Value Ref Range Status   Enterococcus faecalis NOT DETECTED NOT DETECTED Final   Enterococcus Faecium NOT DETECTED NOT DETECTED Final   Listeria monocytogenes NOT DETECTED NOT DETECTED Final   Staphylococcus species NOT DETECTED NOT DETECTED Final   Staphylococcus aureus (BCID) NOT DETECTED NOT DETECTED Final  Staphylococcus epidermidis NOT DETECTED NOT DETECTED Final   Staphylococcus lugdunensis NOT DETECTED NOT DETECTED Final   Streptococcus species NOT DETECTED NOT DETECTED Final   Streptococcus agalactiae NOT DETECTED NOT DETECTED Final   Streptococcus pneumoniae NOT DETECTED NOT DETECTED Final   Streptococcus pyogenes NOT DETECTED NOT DETECTED Final   A.calcoaceticus-baumannii NOT DETECTED NOT DETECTED Final   Bacteroides fragilis NOT DETECTED NOT DETECTED Final   Enterobacterales NOT DETECTED NOT DETECTED Final   Enterobacter cloacae complex NOT DETECTED NOT DETECTED Final   Escherichia coli NOT DETECTED NOT DETECTED Final   Klebsiella aerogenes NOT DETECTED NOT DETECTED Final   Klebsiella oxytoca NOT DETECTED NOT DETECTED Final   Klebsiella  pneumoniae NOT DETECTED NOT DETECTED Final   Proteus species NOT DETECTED NOT DETECTED Final   Salmonella species NOT DETECTED NOT DETECTED Final   Serratia marcescens NOT DETECTED NOT DETECTED Final   Haemophilus influenzae NOT DETECTED NOT DETECTED Final   Neisseria meningitidis NOT DETECTED NOT DETECTED Final   Pseudomonas aeruginosa DETECTED (A) NOT DETECTED Final    Comment: CRITICAL RESULT CALLED TO, READ BACK BY AND VERIFIED WITH: BLaural Benes RN 08/02/22 @ 2248 BY AB    Stenotrophomonas maltophilia NOT DETECTED NOT DETECTED Final   Candida albicans NOT DETECTED NOT DETECTED Final   Candida auris NOT DETECTED NOT DETECTED Final   Candida glabrata NOT DETECTED NOT DETECTED Final   Candida krusei NOT DETECTED NOT DETECTED Final   Candida parapsilosis NOT DETECTED NOT DETECTED Final   Candida tropicalis NOT DETECTED NOT DETECTED Final   Cryptococcus neoformans/gattii NOT DETECTED NOT DETECTED Final   CTX-M ESBL NOT DETECTED NOT DETECTED Final   Carbapenem resistance IMP NOT DETECTED NOT DETECTED Final   Carbapenem resistance KPC NOT DETECTED NOT DETECTED Final   Carbapenem resistance NDM NOT DETECTED NOT DETECTED Final   Carbapenem resistance VIM NOT DETECTED NOT DETECTED Final    Comment: Performed at [facility], [address], [place], [place] [address]  Urine Culture (for pregnant, neutropenic or urologic patients or patients with an indwelling urinary catheter)     Status: None   Collection Time: 08/03/22  6:00 PM   Specimen: Urine, Clean Catch  Result Value Ref Range Status   Specimen Description   Final    URINE, CLEAN CATCH Performed at [facility], [address], [place], [place] [address]    Special Requests   Final    NONE Performed at [facility], [address] [place], [place] [address]    Culture   Final    NO GROWTH Performed at [facility], [address] [place], [place] [address]    Report Status 08/05/2022 FINAL  Final     Labs: CBC: Recent Labs  Lab 08/01/22 1844 08/02/22 0425 08/03/22 0423 08/05/22 0728 08/06/22 0518  WBC 21.0* 18.4* 13.4* 7.4 8.0  HGB 13.9 12.9* 12.3* 13.9 13.6  HCT 42.3 38.0* 36.7* 40.5 41.0  MCV 92.8 91.6 93.1 90.4 91.1  PLT 226 169 147* 191 198   Basic Metabolic Panel: Recent Labs  Lab 08/01/22 1844 08/02/22 0425 08/03/22 0423 08/05/22 0728 08/06/22 0518  NA 136 134* 133* 137 135  K 3.9 3.6 3.8 3.7 3.8  CL 99 100 100 99 99  CO2 25 23 25 28 27   GLUCOSE 187* 130* 115* 111* 110*  BUN 30* 30* 35* 21 21  CREATININE 1.15 1.16 1.19 1.00 1.06  CALCIUM 8.6* 8.0* 8.1* 8.8* 8.8*  MG  --  1.3* 2.0  --   --  PHOS  --  2.9  --   --   --    Liver Function Tests: Recent Labs  Lab 08/02/22 0425  AST 14*  ALT 17  ALKPHOS 56  BILITOT 3.7*  PROT 6.4*  ALBUMIN 3.4*   CBG: Recent Labs  Lab 08/04/22 2110 08/05/22 0730 08/05/22 1105 08/05/22 1618 08/05/22 2037  GLUCAP 138* 99 128* 135* 133*    Discharge time spent: greater than 40 minutes.  Signed: Kendell Bane, MD [facility] 08/06/2022

## 2022-08-06 NOTE — Progress Notes (Addendum)
Patient discharged home today, transported home by family. Discharge paperwork went over with patient and spouse, both verbalized understanding. Foley catheter intact at discharge. Belonging sent home with patient.

## 2022-08-07 LAB — CULTURE, BLOOD (ROUTINE X 2)

## 2022-08-08 LAB — CULTURE, BLOOD (ROUTINE X 2): Special Requests: ADEQUATE

## 2022-08-09 LAB — CULTURE, BLOOD (ROUTINE X 2)

## 2022-08-10 LAB — CULTURE, BLOOD (ROUTINE X 2): Culture: NO GROWTH

## 2022-08-16 ENCOUNTER — Encounter (HOSPITAL_BASED_OUTPATIENT_CLINIC_OR_DEPARTMENT_OTHER): Payer: Self-pay | Admitting: Urology

## 2022-08-16 ENCOUNTER — Other Ambulatory Visit: Payer: Self-pay

## 2022-08-16 ENCOUNTER — Other Ambulatory Visit: Payer: Self-pay | Admitting: Urology

## 2022-08-16 NOTE — Progress Notes (Signed)
Spoke w/ via phone for pre-op interview---PT Lab needs dos---- NONE              Lab results------cbc, bmp done 08-06-2022 epic, EKG 07-15-2022 epic COVID test -----patient states asymptomatic no test needed Arrive at -------830 am 08-22-2022 NPO after MN NO Solid Food.  Clear liquids from MN until---730 am Med rec completed Medications to take morning of surgery -----namenda,carbidopa-levadopa, escitalopram Diabetic medication -----n/a Patient instructed no nail polish to be worn day of surgery Patient instructed to bring photo id and insurance card day of surgery Patient aware to have Driver (ride ) / caregiver  wife sandy  for 24 hours after surgery  Patient Special Instructions -----none Pre-Op special Instructions -----none Patient verbalized understanding of instructions that were given at this phone interview. Patient denies shortness of breath, chest pain, fever, cough at this phone interview.

## 2022-08-22 ENCOUNTER — Ambulatory Visit (HOSPITAL_BASED_OUTPATIENT_CLINIC_OR_DEPARTMENT_OTHER)
Admission: RE | Admit: 2022-08-22 | Discharge: 2022-08-22 | Disposition: A | Discharge status: Home / Self Care | Payer: BC Managed Care – PPO | Source: Ambulatory Visit | Attending: Urology | Admitting: Urology

## 2022-08-22 ENCOUNTER — Ambulatory Visit (HOSPITAL_BASED_OUTPATIENT_CLINIC_OR_DEPARTMENT_OTHER): Payer: BC Managed Care – PPO | Admitting: Anesthesiology

## 2022-08-22 ENCOUNTER — Other Ambulatory Visit: Payer: Self-pay

## 2022-08-22 ENCOUNTER — Encounter (HOSPITAL_BASED_OUTPATIENT_CLINIC_OR_DEPARTMENT_OTHER)
Admission: RE | Disposition: A | Discharge status: Home / Self Care | Payer: Self-pay | Source: Ambulatory Visit | Attending: Urology

## 2022-08-22 ENCOUNTER — Encounter (HOSPITAL_BASED_OUTPATIENT_CLINIC_OR_DEPARTMENT_OTHER): Payer: Self-pay | Admitting: Urology

## 2022-08-22 DIAGNOSIS — I10 Essential (primary) hypertension: Secondary | ICD-10-CM | POA: Diagnosis not present

## 2022-08-22 DIAGNOSIS — Z7984 Long term (current) use of oral hypoglycemic drugs: Secondary | ICD-10-CM | POA: Insufficient documentation

## 2022-08-22 DIAGNOSIS — E1165 Type 2 diabetes mellitus with hyperglycemia: Secondary | ICD-10-CM | POA: Diagnosis not present

## 2022-08-22 DIAGNOSIS — Z01818 Encounter for other preprocedural examination: Secondary | ICD-10-CM

## 2022-08-22 DIAGNOSIS — N201 Calculus of ureter: Secondary | ICD-10-CM | POA: Diagnosis present

## 2022-08-22 HISTORY — DX: Sepsis, unspecified organism: R65.20

## 2022-08-22 HISTORY — PX: CYSTOSCOPY/URETEROSCOPY/HOLMIUM LASER/STENT PLACEMENT: SHX6546

## 2022-08-22 LAB — GLUCOSE, CAPILLARY
Glucose-Capillary: 139 mg/dL — ABNORMAL HIGH (ref 70–99)
Glucose-Capillary: 146 mg/dL — ABNORMAL HIGH (ref 70–99)

## 2022-08-22 SURGERY — CYSTOSCOPY/URETEROSCOPY/HOLMIUM LASER/STENT PLACEMENT
Anesthesia: General | Laterality: Right

## 2022-08-22 MED ORDER — MIDAZOLAM HCL 2 MG/2ML IJ SOLN
INTRAMUSCULAR | Status: AC
  Filled 2022-08-22: qty 2

## 2022-08-22 MED ORDER — EPHEDRINE 5 MG/ML INJ
INTRAVENOUS | Status: AC
  Filled 2022-08-22: qty 5

## 2022-08-22 MED ORDER — ONDANSETRON HCL 4 MG/2ML IJ SOLN
INTRAMUSCULAR | Status: DC | PRN
  Administered 2022-08-22: 4 mg via INTRAVENOUS

## 2022-08-22 MED ORDER — ONDANSETRON HCL 4 MG/2ML IJ SOLN
INTRAMUSCULAR | Status: AC
  Filled 2022-08-22: qty 2

## 2022-08-22 MED ORDER — FENTANYL CITRATE (PF) 100 MCG/2ML IJ SOLN
INTRAMUSCULAR | Status: AC
  Filled 2022-08-22: qty 2

## 2022-08-22 MED ORDER — DEXAMETHASONE SODIUM PHOSPHATE 10 MG/ML IJ SOLN
INTRAMUSCULAR | Status: DC | PRN
  Administered 2022-08-22: 4 mg via INTRAVENOUS

## 2022-08-22 MED ORDER — OXYCODONE HCL 5 MG PO TABS: 5.0000 mg | ORAL_TABLET | Freq: Once | ORAL | Status: DC | PRN

## 2022-08-22 MED ORDER — FENTANYL CITRATE (PF) 100 MCG/2ML IJ SOLN
INTRAMUSCULAR | Status: DC | PRN
  Administered 2022-08-22: 50 ug via INTRAVENOUS
  Administered 2022-08-22 (×2): 25 ug via INTRAVENOUS

## 2022-08-22 MED ORDER — SODIUM CHLORIDE 0.9 % IR SOLN
Status: DC | PRN
  Administered 2022-08-22: 3000 mL via INTRAVESICAL

## 2022-08-22 MED ORDER — CIPROFLOXACIN HCL 500 MG PO TABS: 500.0000 mg | ORAL_TABLET | Freq: Two times a day (BID) | ORAL | 0 refills | Status: AC

## 2022-08-22 MED ORDER — PROPOFOL 10 MG/ML IV BOLUS
INTRAVENOUS | Status: AC
  Filled 2022-08-22: qty 20

## 2022-08-22 MED ORDER — ONDANSETRON HCL 4 MG/2ML IJ SOLN: 4.0000 mg | Freq: Once | INTRAMUSCULAR | Status: DC | PRN

## 2022-08-22 MED ORDER — EPHEDRINE SULFATE-NACL 50-0.9 MG/10ML-% IV SOSY
PREFILLED_SYRINGE | INTRAVENOUS | Status: DC | PRN
  Administered 2022-08-22: 10 mg via INTRAVENOUS

## 2022-08-22 MED ORDER — DEXAMETHASONE SODIUM PHOSPHATE 10 MG/ML IJ SOLN
INTRAMUSCULAR | Status: AC
  Filled 2022-08-22: qty 1

## 2022-08-22 MED ORDER — IOHEXOL 300 MG/ML  SOLN
INTRAMUSCULAR | Status: DC | PRN
  Administered 2022-08-22: 8 mL via URETHRAL

## 2022-08-22 MED ORDER — LIDOCAINE 2% (20 MG/ML) 5 ML SYRINGE
INTRAMUSCULAR | Status: DC | PRN
  Administered 2022-08-22: 60 mg via INTRAVENOUS

## 2022-08-22 MED ORDER — LACTATED RINGERS IV SOLN: INTRAVENOUS | Status: DC

## 2022-08-22 MED ORDER — HYDROCODONE-ACETAMINOPHEN 5-325 MG PO TABS: 1.0000 | ORAL_TABLET | ORAL | 0 refills | Status: DC | PRN

## 2022-08-22 MED ORDER — PROPOFOL 10 MG/ML IV BOLUS
INTRAVENOUS | Status: DC | PRN
  Administered 2022-08-22: 120 mg via INTRAVENOUS
  Administered 2022-08-22: 30 mg via INTRAVENOUS

## 2022-08-22 MED ORDER — HYDROMORPHONE HCL 1 MG/ML IJ SOLN
INTRAMUSCULAR | Status: AC
  Filled 2022-08-22: qty 1

## 2022-08-22 MED ORDER — FENTANYL CITRATE (PF) 100 MCG/2ML IJ SOLN
25.0000 ug | INTRAMUSCULAR | Status: DC | PRN
  Administered 2022-08-22: 25 ug via INTRAVENOUS

## 2022-08-22 MED ORDER — OXYCODONE HCL 5 MG/5ML PO SOLN: 5.0000 mg | Freq: Once | ORAL | Status: DC | PRN

## 2022-08-22 MED ORDER — CIPROFLOXACIN IN D5W 400 MG/200ML IV SOLN
400.0000 mg | INTRAVENOUS | Status: AC
  Administered 2022-08-22: 400 mg via INTRAVENOUS

## 2022-08-22 MED ORDER — CIPROFLOXACIN IN D5W 400 MG/200ML IV SOLN
INTRAVENOUS | Status: AC
  Filled 2022-08-22: qty 200

## 2022-08-22 SURGICAL SUPPLY — 20 items
BAG DRAIN URO-CYSTO SKYTR STRL (DRAIN) ×1 IMPLANT
BAG DRN UROCATH (DRAIN) ×1
CATH URETL OPEN END 6FR 70 (CATHETERS) IMPLANT
CLOTH BEACON ORANGE TIMEOUT ST (SAFETY) ×1 IMPLANT
GLOVE BIO SURGEON STRL SZ7.5 (GLOVE) ×1 IMPLANT
GOWN STRL REUS W/TWL XL LVL3 (GOWN DISPOSABLE) IMPLANT
GUIDEWIRE STR DUAL SENSOR (WIRE) ×1 IMPLANT
IV NS IRRIG 3000ML ARTHROMATIC (IV SOLUTION) ×1 IMPLANT
KIT TURNOVER CYSTO (KITS) ×1 IMPLANT
LASER FIB FLEXIVA PULSE ID 365 (Laser) IMPLANT
MANIFOLD NEPTUNE II (INSTRUMENTS) ×1 IMPLANT
NS IRRIG 500ML POUR BTL (IV SOLUTION) ×1 IMPLANT
PACK CYSTO (CUSTOM PROCEDURE TRAY) ×1 IMPLANT
SHEATH NAVIGATOR HD 12/14X36 (SHEATH) IMPLANT
SLEEVE SCD COMPRESS KNEE MED (STOCKING) ×1 IMPLANT
STENT URET 6FRX26 CONTOUR (STENTS) IMPLANT
TRACTIP FLEXIVA PULS ID 200XHI (Laser) IMPLANT
TRACTIP FLEXIVA PULSE ID 200 (Laser)
TUBE CONNECTING 12X1/4 (SUCTIONS) IMPLANT
TUBING UROLOGY SET (TUBING) IMPLANT

## 2022-08-22 NOTE — Transfer of Care (Signed)
Immediate Anesthesia Transfer of Care Note  Patient: Leonard Mercer  Procedure(s) Performed: CYSTOSCOPY RIGHT URETEROSCOPY/STENT EXCHANGE (Right)  Patient Location: PACU  Anesthesia Type:General  Level of Consciousness: drowsy and responds to stimulation  Airway & Oxygen Therapy: Patient Spontanous Breathing with OAW  Post-op Assessment: Report given to RN and Post -op Vital signs reviewed and stable  Post vital signs: Reviewed and stable  Last Vitals:  Vitals Value Taken Time  BP 145/94 08/22/22 1135  Temp    Pulse 71 08/22/22 1137  Resp 10 08/22/22 1137  SpO2 96 % 08/22/22 1137  Vitals shown include unvalidated device data.  Last Pain:  Vitals:   08/22/22 0843  TempSrc: Oral         Complications: No notable events documented.

## 2022-08-22 NOTE — OR Nursing (Signed)
Right urethral stent removed at 1106 by Dr.Bell

## 2022-08-22 NOTE — Anesthesia Preprocedure Evaluation (Addendum)
Anesthesia Evaluation  Patient identified by MRN, date of birth, ID band Patient awake    Reviewed: Allergy & Precautions, NPO status , Patient's Chart, lab work & pertinent test results  History of Anesthesia Complications (+) PROLONGED EMERGENCE and history of anesthetic complications  Airway Mallampati: IV  TM Distance: >3 FB Neck ROM: Limited  Mouth opening: Limited Mouth Opening  Dental  (+) Caps, Dental Advisory Given,    Pulmonary neg pulmonary ROS   Pulmonary exam normal breath sounds clear to auscultation       Cardiovascular hypertension, Normal cardiovascular exam Rhythm:Regular Rate:Normal  EKG 07/15/22 NSR, Normal   Neuro/Psych Parkinson's disease   negative psych ROS   GI/Hepatic negative GI ROS, Neg liver ROS,,,  Endo/Other  diabetes, Poorly Controlled, Type 2, Oral Hypoglycemic Agents  Hyperlipidemia  Renal/GU Renal diseaseRight ureteral calculus   BPH    Musculoskeletal negative musculoskeletal ROS (+)    Abdominal   Peds  Hematology negative hematology ROS (+)   Anesthesia Other Findings Tremor  Reproductive/Obstetrics ED                             Anesthesia Physical Anesthesia Plan  ASA: 3  Anesthesia Plan: General   Post-op Pain Management: Dilaudid IV, Tylenol PO (pre-op)* and Ofirmev IV (intra-op)*   Induction:   PONV Risk Score and Plan: 3 and Treatment may vary due to age or medical condition and Ondansetron  Airway Management Planned: LMA  Additional Equipment: None  Intra-op Plan:   Post-operative Plan: Extubation in OR  Informed Consent: I have reviewed the patients History and Physical, chart, labs and discussed the procedure including the risks, benefits and alternatives for the proposed anesthesia with the patient or authorized representative who has indicated his/her understanding and acceptance.     Dental advisory given  Plan  Discussed with: CRNA and Anesthesiologist  Anesthesia Plan Comments:         Anesthesia Quick Evaluation

## 2022-08-22 NOTE — H&P (Signed)
CC/HPI: CC: Right ureteral calculus  HPI:  07/12/2022  Patient went to the emergency department on 5/26 with severe right-sided flank pain. Has a history of nephrolithiasis x 2. Never required any intervention. Underwent a CT stone protocol that revealed a 7 mm obstructing right mid ureteral calculus as well as multiple left renal calculi. He continues to have significant right-sided abdominal pain. Denies any fever, chills. Takes Zofran to help keep the pain medication down.   07/19/2022: Leonard Mercer is a 67 year old man who underwent a right-sided ureteroscopy. His surgery went well but was complicated by urinary retention. Currently has a Foley catheter and right ureteral stent. Right ureteral stent needs to stay in place for 1 month per Dr. Shannan Harper note due to stone impaction. He went to the ER last night due to concerns of leakage around his catheter bag. He was given a course of Pyridium. He presents today for follow-up. He endorses continued leakage around his catheter at the urethral meatus.   08/04/2022  Postoperatively, patient had a complication of urinary retention. Now has Foley catheter. Also postoperatively had UTI sepsis and was admitted to the hospital ultimately with Pseudomonas positive blood cultures. On ciprofloxacin. Here for voiding trial. He also had a CT scan performed that showed stone possibly within the ureter. However, ureter may be extraluminal. During the procedure, the stone perforated the ureter and was pushed outside the ureter. There was not a complete ureteral transection.     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  <DIV'  Tobacco Use Assessment Completed:  Used Tobacco in last 30 days?   Has never drank.  Drinks 4+ caffeinated drinks per day. Patient's occupation Pharmacist, community.     REVIEW OF SYSTEMS:     GU Review Male:  Patient denies frequent urination, hard to postpone urination, burning/ pain with urination, get up at night to urinate, leakage of urine, stream starts and stops, trouble starting your stream, have to strain to urinate , erection problems, and penile pain.    Gastrointestinal (Upper):  Patient denies nausea, vomiting, and indigestion/ heartburn.    Gastrointestinal (Lower):  Patient denies diarrhea and constipation.    Constitutional:  Patient denies fever, night sweats, weight loss, and fatigue.    Skin:  Patient denies skin rash/ lesion and itching.    Eyes:  Patient denies blurred vision and double vision.    Ears/ Nose/ Throat:  Patient denies sore throat and sinus problems.  Hematologic/Lymphatic:  Patient denies swollen glands and easy bruising.    Cardiovascular:  Patient denies leg swelling and chest pains.    Respiratory:  Patient denies cough and shortness of breath.    Endocrine:  Patient denies excessive thirst.    Musculoskeletal:   Patient denies back pain and joint pain.    Neurological:  Patient denies headaches and dizziness.    Psychologic:  Patient denies depression and anxiety.    VITAL SIGNS: None     MULTI-SYSTEM PHYSICAL EXAMINATION:      Constitutional: Well-nourished. No physical deformities. Normally developed. Good grooming.     Gastrointestinal: No mass, no tenderness, no rigidity, non obese abdomen.     Eyes: Normal conjunctivae. Normal eyelids.     Musculoskeletal: Normal gait and station of head and neck.            Complexity of Data:   Source Of History:  Patient  Records Review:  Previous Doctor Records, Previous Patient Records  X-Ray Review: C.T. Abdomen/Pelvis: Reviewed Films. Reviewed Report. Discussed With Patient.     PROCEDURES:       Voiding Trial - 16109  Instilled Volume: 200 cc  Voided Volume: 150 cc    ASSESSMENT:     ICD-10 Details  1 GU:  Ureteral calculus - N20.1 Chronic, Stable  2  Urinary Retention - R33.8 Chronic, Stable   PLAN:   Document  Letter(s):  Created for Patient: Clinical Summary   Notes:  Will plan for cystoscopy with right retrograde pyelogram, possible right ureteroscopy with laser lithotripsy and ureteral stent exchange. I want to ensure everything has healed appropriately and that all the stone burden within the ureteral lumen has been removed.   CC: Mady Gemma   Signed by Modena Slater, III, M.D. on 08/12/22 at 4:25 PM (EDT

## 2022-08-22 NOTE — Op Note (Signed)
Operative Note  Preoperative diagnosis:  1.  Right ureteral calculi  Postoperative diagnosis: 1.  Right ureteral calculi  Procedure(s): 1.  Cystoscopy with right retrograde pyelogram, right diagnostic ureteroscopy, right ureteral stent exchange  Surgeon: Modena Slater, MD  Assistants: None  Anesthesia: General  Complications: None immediate  EBL: Minimal  Specimens: 1.  None  Drains/Catheters: 1.  6 x 26 double-J ureteral stent  Intraoperative findings: 1.  Normal anterior urethra 2.  Short mildly obstructing prostate 3.  Bladder mucosa without any tumors or masses 4.  Right retrograde pyelogram revealed no hydronephrosis and no filling defect.  Ureteroscopy confirmed that there were no remaining stones.  Indication: 67 year old male status post right ureteroscopy with laser lithotripsy and ureteral stent placement.  Procedure was complicated by one of the stones being pushed through the ureter.  On CT scan it was unclear if he had any stones remaining in the ureter so he returns for the previously mentioned operation.  Description of procedure:  The patient was identified and consent was obtained.  The patient was taken to the operating room and placed in the supine position.  The patient was placed under general anesthesia.  Perioperative antibiotics were administered.  The patient was placed in dorsal lithotomy.  Patient was prepped and draped in a standard sterile fashion and a timeout was performed.  A 21 French rigid cystoscope was advanced into the urethra and into the bladder.  Complete cystoscopy was performed with findings noted above.  The stent was grasped and pulled just beyond the urethral meatus.  A wire was advanced through this and into the kidney under fluoroscopic guidance.  Semirigid ureteroscopy was performed alongside the wire up to the mid to proximal ureter.  I passed a second wire through the scope and was able to slide the scope over the wire up to the renal  pelvis.  I visualized the ureter and did not identify any obvious stone.  I passed a second wire through the scope and into the kidney and withdrew the scope.  An 11 x 13 ureteral access sheath was advanced over 1 the wires under continuous fluoroscopic guidance up to the mid to proximal ureter.  Inner sheath and wire were withdrawn.  Digital ureteroscopy was performed.  No stones were seen in the kidney nor along the ureter.  I shot a retrograde pyelogram through the scope with no abnormal findings.  I withdrew the scope visualizing the ureter upon removal of the access sheath as well.  There was no ureteral injury identified and no ureteral stones were seen.  Prior stone seen on CT are outside of the ureter.  I backloaded the wire onto the rigid cystoscope and advanced that into the bladder followed by routine placement of a 6 x 26 double-J ureteral stent.  Fluoroscopy confirmed proximal placement and direct visualization confirmed a good coil within the bladder.  I drained the bladder and withdrew the scope.  Patient tolerated the procedure well was stable postoperatively.  Plan: Return in 1 week for stent removal.  He will need a renal ultrasound in 4 to 6 weeks after stent removal.

## 2022-08-22 NOTE — Anesthesia Postprocedure Evaluation (Signed)
Anesthesia Post Note  Patient: ALIAS GRAHN  Procedure(s) Performed: CYSTOSCOPY RIGHT URETEROSCOPY/STENT EXCHANGE (Right)     Patient location during evaluation: PACU Anesthesia Type: General Level of consciousness: awake and alert and oriented Pain management: pain level controlled Vital Signs Assessment: post-procedure vital signs reviewed and stable Respiratory status: spontaneous breathing, nonlabored ventilation and respiratory function stable Cardiovascular status: blood pressure returned to baseline and stable Postop Assessment: no apparent nausea or vomiting Anesthetic complications: no   No notable events documented.  Last Vitals:  Vitals:   08/22/22 1238 08/22/22 1240  BP:    Pulse: 66 66  Resp: (!) 9 14  Temp: 36.5 C   SpO2: 97% 98%    Last Pain:  Vitals:   08/22/22 1238  TempSrc: Oral  PainSc:                  Duston Smolenski A.

## 2022-08-22 NOTE — Anesthesia Procedure Notes (Signed)
Procedure Name: LMA Insertion Date/Time: 08/22/2022 10:53 AM  Performed by: Bishop Limbo, CRNAPre-anesthesia Checklist: Patient identified, Emergency Drugs available, Suction available and Patient being monitored Patient Re-evaluated:Patient Re-evaluated prior to induction Oxygen Delivery Method: Circle System Utilized Preoxygenation: Pre-oxygenation with 100% oxygen Induction Type: IV induction Ventilation: Mask ventilation without difficulty LMA: LMA inserted LMA Size: 4.0 Number of attempts: 1 Placement Confirmation: positive ETCO2 Tube secured with: Tape Dental Injury: Teeth and Oropharynx as per pre-operative assessment

## 2022-08-22 NOTE — Discharge Instructions (Addendum)
[facility] [phone] Post Ureteroscopy With or Without Stent Instructions  Definitions:  Ureter: The duct that transports urine from the kidney to the bladder. Stent:   A plastic hollow tube that is placed into the ureter, from the kidney to the                 bladder to prevent the ureter from swelling shut.  GENERAL INSTRUCTIONS:  Despite the fact that no skin incisions were used, the area around the ureter and bladder is raw and irritated. The stent is a foreign body which will further irritate the bladder wall. This irritation is manifested by increased frequency of urination, both day and night, and by an increase in the urge to urinate. In some, the urge to urinate is present almost always. Sometimes the urge is strong enough that you may not be able to stop yourself from urinating. The only real cure is to remove the stent and then give time for the bladder wall to heal which can't be done until the danger of the ureter swelling shut has passed, which varies.  You may see some blood in your urine while the stent is in place and a few days afterwards. Do not be alarmed, even if the urine was clear for a while. Get off your feet and drink lots of fluids until clearing occurs. If you start to pass clots or don't improve, call us.  DIET: You may return to your normal diet immediately. Because of the raw surface of your bladder, alcohol, spicy foods, acid type foods and drinks with caffeine may cause irritation or frequency and should be used in moderation. To keep your urine flowing freely and to avoid constipation, drink plenty of fluids during the day ( 8-10 glasses ). Tip: Avoid cranberry juice because it is very acidic.  ACTIVITY: Your physical activity doesn't need to be restricted. However, if you are very active, you may see some blood in your urine. We suggest that you reduce your activity under these circumstances until the bleeding has stopped.  BOWELS: It is  important to keep your bowels regular during the postoperative period. Straining with bowel movements can cause bleeding. A bowel movement every other day is reasonable. Use a mild laxative if needed, such as Milk of Magnesia 2-3 tablespoons, or 2 Dulcolax tablets. Call if you continue to have problems. If you have been taking narcotics for pain, before, during or after your surgery, you may be constipated. Take a laxative if necessary.   MEDICATION: You should resume your pre-surgery medications unless told not to. You may take oxybutynin or flomax if prescribed for bladder spasms or discomfort from the stent Take pain medication as directed for pain refractory to conservative management  PROBLEMS YOU SHOULD REPORT TO US: Fevers over 100.5 Fahrenheit. Heavy bleeding, or clots ( See above notes about blood in urine ). Inability to urinate. Drug reactions ( hives, rash, nausea, vomiting, diarrhea ). Severe burning or pain with urination that is not improving.  Post Anesthesia Home Care Instructions  Activity: Get plenty of rest for the remainder of the day. A responsible adult should stay with you for 24 hours following the procedure.  For the next 24 hours, DO NOT: -Drive a car -Operate machinery -Drink alcoholic beverages -Take any medication unless instructed by your physician -Make any legal decisions or sign important papers.  Meals: Start with liquid foods such as gelatin or soup. Progress to regular foods as tolerated. Avoid greasy, spicy, heavy   foods. If nausea and/or vomiting occur, drink only clear liquids until the nausea and/or vomiting subsides. Call your physician if vomiting continues.  Special Instructions/Symptoms: Your throat may feel dry or sore from the anesthesia or the breathing tube placed in your throat during surgery. If this causes discomfort, gargle with warm salt water. The discomfort should disappear within 24 hours.      

## 2022-08-23 ENCOUNTER — Encounter (HOSPITAL_BASED_OUTPATIENT_CLINIC_OR_DEPARTMENT_OTHER): Payer: Self-pay | Admitting: Urology

## 2022-10-03 ENCOUNTER — Other Ambulatory Visit: Payer: Self-pay

## 2022-10-03 MED ORDER — MEMANTINE HCL 10 MG PO TABS: 10.0000 mg | ORAL_TABLET | Freq: Two times a day (BID) | ORAL | 3 refills | Status: AC
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
## 2022-11-29 ENCOUNTER — Ambulatory Visit: Payer: Medicare PPO | Admitting: Adult Health

## 2022-11-29 ENCOUNTER — Encounter: Payer: Self-pay | Admitting: Adult Health

## 2022-11-29 VITALS — BP 142/100 | HR 79 | Ht 71.0 in | Wt 164.0 lb

## 2022-11-29 DIAGNOSIS — G20A1 Parkinson's disease without dyskinesia, without mention of fluctuations: Secondary | ICD-10-CM

## 2022-11-29 MED ORDER — CARBIDOPA-LEVODOPA 25-100 MG PO TABS: 1.0000 | ORAL_TABLET | Freq: Four times a day (QID) | ORAL | 11 refills | Status: DC

## 2022-11-29 NOTE — Patient Instructions (Addendum)
Your Plan:  Suspect the worsening of your symptoms are more due to recent illness/medical issues and increased stress. Also too much caffeine can worsen tremors, try to deduce caffeine intake and increase water intake   Adjust times you are taking Sinement IR and CR and avoid taking with food (at least 1 hour before or 1 hour after)   Recommend: Sinemet IR at 7am, 11am, 3pm and 7pm Sinemet CR at 9am, 3pm and 8pm     Follow up in 6 months or call earlier if needed     Thank you for coming to see [place] at [facility]. I hope we have been able to provide you high quality care today.  You may receive a patient satisfaction survey over the next few weeks. We would appreciate your feedback and comments so that we may continue to improve ourselves and the health of our patients.

## 2022-12-19 ENCOUNTER — Telehealth: Payer: Self-pay

## 2022-12-19 DIAGNOSIS — G20A1 Parkinson's disease without dyskinesia, without mention of fluctuations: Secondary | ICD-10-CM

## 2022-12-19 NOTE — Telephone Encounter (Signed)
Spoke with [facility] - stated patient self referred himself for PT in August and was seen three times before being hospitalized. States he now wants to continue PT and they are requesting a referral from our office.  The last referral I can see placed to PT was back in March. Can you please advise if you are amenable to placing a new PT referral.  If so, it can be faxed to [facility] at  [phone]  Thanks!

## 2022-12-19 NOTE — Telephone Encounter (Signed)
That's odd. Patient told me he was actively working with PT at recent visit? Either way, I will place order to [facility] for Parkinson's disease as requested. Thank you.

## 2022-12-22 ENCOUNTER — Telehealth: Payer: Self-pay | Admitting: Adult Health

## 2022-12-22 NOTE — Telephone Encounter (Signed)
Referral  for physical therapy fax to [facility] [place]. Phone: [phone], Fax: [phone]

## 2023-03-13 ENCOUNTER — Telehealth: Payer: Self-pay | Admitting: Adult Health

## 2023-03-13 NOTE — Telephone Encounter (Signed)
Pt stated, carbidopa-levodopa (SINEMET IR) 25-100 MG tablet  current dosage is no longer working. Would like a call from the nurse to discuss increasing the dosage

## 2023-03-14 NOTE — Telephone Encounter (Signed)
I corrected my prior note as I incorrectly documented the Sinemet IR and CR frequency in the plan section, he should be taking Sinemet IR 1 tab 4 times daily and Sinemet CR 1 tab 3 times daily as he reported taking.   Dr. Frances Furbish, do you think he could try increasing the Sinemet IR gradually to 1.5 tabs 4 times daily?  Or any other recommendations?

## 2023-03-14 NOTE — Telephone Encounter (Signed)
Noted! Thank you

## 2023-03-14 NOTE — Telephone Encounter (Signed)
Spoke to patient gave Dr Johny Sax recommendation increasing  sinemet 25-100 Per Dr Frances Furbish  immediate release carbidopa-levodopa 1 1/2 pills for his first and third dose and his usual 1 pill with the second and fourth dose for now, may increase to 1-1/2 pills 4 times a day after about 2 weeks until the next appointment with Shanda Bumps. Also informed pt per Dr Frances Furbish to drink 6 to 8 cups of water per day, limit his caffeine to 1 or 2 servings per day and try to achieve a set schedule for sleep if possible, may try melatonin at night for sleep, about 5 mg 1 hour before bedtime. Pt wrote down instructions and read back instructions of new dosage of sinemet 25-100. Pt thanked me for calling . Informed pt if any questions feel free to call office back

## 2023-03-14 NOTE — Telephone Encounter (Signed)
At the last clinic visit in October with Shanda Bumps, he reported not sleeping very well and drinking quite a bit of caffeine in the form of soda.  He was dealing with a lot of stress what with his wife's health status and having to be her caretaker.   I would like to reinforce the importance of supportive measures for improvement of parkinsonian symptoms.   He is encouraged to drink 6 to 8 cups of water per day, limit his caffeine to 1 or 2 servings per day and try to achieve a set schedule for sleep if possible, may try melatonin at night for sleep, about 5 mg 1 hour before bedtime.    If he would like to try a mild increase in his levodopa, I recommend he take immediate release carbidopa-levodopa 1 1/2 pills for his first and third dose and his usual 1 pill with the second and fourth dose for now, may increase to 1-1/2 pills 4 times a day after about 2 weeks until the next appointment with Shanda Bumps.

## 2023-05-31 ENCOUNTER — Telehealth: Payer: Self-pay | Admitting: Adult Health

## 2023-05-31 NOTE — Telephone Encounter (Signed)
 Appointment details confirmed
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
## 2023-06-01 ENCOUNTER — Ambulatory Visit: Payer: Medicare PPO | Admitting: Adult Health

## 2023-06-01 ENCOUNTER — Telehealth: Payer: Self-pay | Admitting: Adult Health

## 2023-06-01 ENCOUNTER — Encounter: Payer: Self-pay | Admitting: Adult Health

## 2023-06-01 VITALS — BP 141/82 | HR 78 | Ht 71.0 in | Wt 165.0 lb

## 2023-06-01 DIAGNOSIS — G20A1 Parkinson's disease without dyskinesia, without mention of fluctuations: Secondary | ICD-10-CM

## 2023-06-01 DIAGNOSIS — R4589 Other symptoms and signs involving emotional state: Secondary | ICD-10-CM | POA: Diagnosis not present

## 2023-06-01 DIAGNOSIS — G20C Parkinsonism, unspecified: Secondary | ICD-10-CM

## 2023-06-01 DIAGNOSIS — K5909 Other constipation: Secondary | ICD-10-CM | POA: Diagnosis not present

## 2023-06-01 DIAGNOSIS — R413 Other amnesia: Secondary | ICD-10-CM | POA: Diagnosis not present

## 2023-06-01 NOTE — Telephone Encounter (Signed)
 Pt is asking if Camilo Cella, NP believes he is ok to go out on a boat alone or not.

## 2023-06-01 NOTE — Patient Instructions (Addendum)
 Your Plan:  Continue Sinemet CR 1 tab 3 times daily  Continue Sinemet IR to 1.5 tabs 4 times daily  Will follow up with Dr. Omar Bibber regarding further treatment options/recommendations   Continue Namenda 10 mg twice daily for cognition  Increase Lexapro to 20mg  daily as recently advised by your PCP  Continue Miralax as needed for constipation   Ensure you are adequately hydrating with water with at least 64 to 100 ounces of water per day  Follow up with Dr. Parke Boll regarding kidney stones - can contact them at [phone]  Consider restart of PT once pain improves - please let me know if you need help getting this restarted      Follow up with Dr. Omar Bibber in 6 months or call earlier if needed     Thank you for coming to see us  at [facility]. I hope we have been able to provide you high quality care today.  You may receive a patient satisfaction survey over the next few weeks. We would appreciate your feedback and comments so that we may continue to improve ourselves and the health of our patients.

## 2023-06-01 NOTE — Telephone Encounter (Signed)
 Spoke w/Pt and made him aware Camilo Cella, NP does not recommend him going out on his boat alone due to his balance issues, as it would not be safe. Pt stated understanding and thankful for the call.

## 2023-06-01 NOTE — Telephone Encounter (Signed)
 Due to impairment of his balance, I would not recommend him being on a boat by himself.

## 2023-06-05 ENCOUNTER — Telehealth: Payer: Self-pay | Admitting: Adult Health

## 2023-06-05 MED ORDER — CARBIDOPA-LEVODOPA 25-100 MG PO TABS: 2.0000 | ORAL_TABLET | Freq: Four times a day (QID) | ORAL | 11 refills | Status: AC

## 2023-06-05 NOTE — Telephone Encounter (Signed)
 Spoke w/Pt regarding back pain. Pt stated he followed up with his urologist and stated he had no issues or kidney stones. Pt stated the pain occurs anytime whether he is sitting, standing or lying in bed. The pain is intermittent but is becoming more constant. Pt stated at the worst pain is 7-8/10 and at best is 1-2/10. Pt stated he does not take anything for the pain that he usually tries to reposition and that helps or he just waits for the pain to go away on it's own. Pt stated when he did PT before, it helped with his balance but irritated his back to the point he stopped going to PT. Pt is asking what is next. Informed Pt we will send to provider for recommendations.

## 2023-06-05 NOTE — Telephone Encounter (Signed)
 The pain could be from rigidity from Parkinson's disease and increasing his Sinemet  dose could potentially help with this but I would also recommend he follow-up with his PCP to ensure there is no other underlying cause of low back pain. We could try additional PT but more so for low back pain as prior PT for balance aggravated his pain but again, would first be evaluated by PCP to ensure nothing else is contributing.

## 2023-06-05 NOTE — Addendum Note (Signed)
 Addended by: Johny Nap L on: 06/05/2023 11:11 AM   Modules accepted: Orders

## 2023-06-05 NOTE — Telephone Encounter (Signed)
 Pt called wanting to inform provider that he did follow up with his urologist Dr. Parke Boll and his Kidney's were cleared. Pt would like to know what the next step is for this back pain of his. Please advise.

## 2023-06-06 ENCOUNTER — Telehealth: Payer: Self-pay

## 2023-06-06 NOTE — Telephone Encounter (Signed)
-----   Message from Johny Nap sent at 06/05/2023 11:11 AM EDT ----- Please advise patient of Dr. Dail Drought recommendation under A/P section ADDENDUM. Will update Sinemet  IR dosage. Thank you.

## 2023-06-06 NOTE — Telephone Encounter (Signed)
 Spoke w/Pt to make him aware of NP recommendation to f/u w/PCP for evaluation to be sure there are no underlying issues for back pain. Discussed that NP can make a PT referral but did not want to aggravate back pain as was reported that it did before. Pt stated understanding and that he will get in touch w/PCP.

## 2023-06-06 NOTE — Telephone Encounter (Signed)
 Spoke w/Pt regarding increase of Sinemet  IR to 2 tabs QID at 7am, 11am, 3pm, and 7pm. And to continue taking Sinemet  CR 1 tab TID at 9am, 3pm, and 8pm. Reminded Pt of side effects with increased dose of invol movements, dizziness, lightheadedness, hallucinations, nausea, HA, sleepiness and to be cautious about driving. Also reminded Pt to call office if experiences any of the side effects or has other concerns. Informed Pt he will have a follow up visit w/Dr. Omar Bibber in 6 mos. Pt stated understanding.

## 2023-07-03 ENCOUNTER — Telehealth: Payer: Self-pay | Admitting: Adult Health

## 2023-07-03 NOTE — Telephone Encounter (Signed)
 Pt request refill for carbidopa -levodopa  (SINEMET  IR) 25-100 MG tablet send to  [facility] [phone]  Can someone call back to let me know when to go pick it up.

## 2023-07-05 NOTE — Telephone Encounter (Signed)
 LVM informing patient nurse's message: RX was sent [facility] 05/2023 with 11 refills. They should have the medication on file. He should check with [facility] first and if for some reason they don't have it, they can request a refill.

## 2023-07-28 ENCOUNTER — Encounter (HOSPITAL_COMMUNITY): Payer: Self-pay | Admitting: *Deleted

## 2023-07-28 ENCOUNTER — Other Ambulatory Visit: Payer: Self-pay

## 2023-07-28 ENCOUNTER — Emergency Department (HOSPITAL_COMMUNITY)
Admission: EM | Admit: 2023-07-28 | Discharge: 2023-07-29 | Disposition: A | Discharge status: Home / Self Care | Payer: No Typology Code available for payment source | Attending: Emergency Medicine | Admitting: Emergency Medicine

## 2023-07-28 ENCOUNTER — Emergency Department (HOSPITAL_COMMUNITY): Payer: No Typology Code available for payment source

## 2023-07-28 DIAGNOSIS — Y92410 Unspecified street and highway as the place of occurrence of the external cause: Secondary | ICD-10-CM | POA: Insufficient documentation

## 2023-07-28 DIAGNOSIS — M545 Low back pain, unspecified: Secondary | ICD-10-CM | POA: Diagnosis not present

## 2023-07-28 DIAGNOSIS — R110 Nausea: Secondary | ICD-10-CM | POA: Insufficient documentation

## 2023-07-28 DIAGNOSIS — R519 Headache, unspecified: Secondary | ICD-10-CM | POA: Insufficient documentation

## 2023-07-28 DIAGNOSIS — M542 Cervicalgia: Secondary | ICD-10-CM | POA: Insufficient documentation

## 2023-07-28 MED ORDER — ACETAMINOPHEN 325 MG PO TABS
650.0000 mg | ORAL_TABLET | Freq: Once | ORAL | Status: AC
  Filled 2023-07-28: qty 2

## 2023-07-28 MED ORDER — ONDANSETRON 4 MG PO TBDP
4.0000 mg | ORAL_TABLET | Freq: Once | ORAL | Status: AC
  Filled 2023-07-28: qty 1
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
## 2023-07-29 MED ORDER — NAPROXEN 500 MG PO TABS: 500.0000 mg | ORAL_TABLET | Freq: Two times a day (BID) | ORAL | 0 refills | Status: AC

## 2023-07-29 MED ORDER — NAPROXEN 250 MG PO TABS
500.0000 mg | ORAL_TABLET | ORAL | Status: AC
  Filled 2023-07-29: qty 2

## 2023-07-29 MED ORDER — LIDOCAINE 5 % EX PTCH: 1.0000 | MEDICATED_PATCH | CUTANEOUS | 0 refills | Status: AC

## 2023-07-29 MED ORDER — LIDOCAINE 5 % EX PTCH
3.0000 | MEDICATED_PATCH | CUTANEOUS | Status: DC
  Filled 2023-07-29: qty 3
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
## 2023-09-07 ENCOUNTER — Telehealth: Payer: Self-pay | Admitting: Adult Health

## 2023-09-07 NOTE — Telephone Encounter (Signed)
 Pt called to request new Referral to different Physical Therapist the patient states that  He believe  the Physical therapy facility is under new manage ment and Pt doesn't like going there  Pt was referred back in 2024    eferral  for physical therapy fax to [facility] [place]. Phone: [phone], Fax: [phone]

## 2023-09-08 ENCOUNTER — Other Ambulatory Visit (HOSPITAL_COMMUNITY): Payer: Self-pay | Admitting: Family Medicine

## 2023-09-08 DIAGNOSIS — N50819 Testicular pain, unspecified: Secondary | ICD-10-CM

## 2023-09-11 NOTE — Telephone Encounter (Signed)
 Attempted to call Pt. No answer, LVM for call back.

## 2023-09-11 NOTE — Telephone Encounter (Signed)
 Pt cld back to make us  aware PCP had given him a referral for [facility] in [place] for PT. Thanked Pt for making us  aware.

## 2023-09-11 NOTE — Telephone Encounter (Signed)
 Pt cld back regarding PT. Pt stated he saw a provider at his PCP office last week and was told he has degenerative disk disease in his back and neck and the provider was planning on reaching out to [facility]. Informed Pt it is not noted in his chart that his PCP office has contacted [facility] but if his PCP is in agreement to him continuing PT the PCP office can order the PT referral. Pt voiced understanding and that he will be in touch with his PCP office and will call our office back.

## 2023-09-11 NOTE — Telephone Encounter (Signed)
 Pt has returned call to RN

## 2023-09-14 ENCOUNTER — Ambulatory Visit (HOSPITAL_COMMUNITY)
Admission: RE | Admit: 2023-09-14 | Discharge: 2023-09-14 | Disposition: A | Discharge status: Home / Self Care | Source: Ambulatory Visit | Attending: Family Medicine | Admitting: Family Medicine

## 2023-09-14 DIAGNOSIS — N50819 Testicular pain, unspecified: Secondary | ICD-10-CM | POA: Insufficient documentation

## 2023-10-09 ENCOUNTER — Telehealth: Payer: Self-pay | Admitting: Neurology

## 2023-10-09 MED ORDER — CARBIDOPA-LEVODOPA ER 50-200 MG PO TBCR: 1.0000 | EXTENDED_RELEASE_TABLET | Freq: Three times a day (TID) | ORAL | 3 refills | Status: AC

## 2023-10-09 NOTE — Telephone Encounter (Signed)
 Refilled as requested

## 2023-10-09 NOTE — Telephone Encounter (Signed)
 Pt called to request medication refill ,Pharmacy informed PT to call MD   carbidopa -levodopa  (SINEMET  CR) 50-200 MG tablet   Pt would like medication sent to    [facility] [phone] - , [place] - [address] AT [facility] [address] (Ph: [phone])

## 2023-11-16 ENCOUNTER — Encounter: Payer: Self-pay | Admitting: Neurology

## 2023-11-16 ENCOUNTER — Ambulatory Visit: Admitting: Neurology

## 2023-11-16 VITALS — BP 146/91 | HR 69 | Ht 71.0 in | Wt 170.0 lb

## 2023-11-16 DIAGNOSIS — G20A1 Parkinson's disease without dyskinesia, without mention of fluctuations: Secondary | ICD-10-CM | POA: Diagnosis not present
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
## 2023-11-22 ENCOUNTER — Encounter (INDEPENDENT_AMBULATORY_CARE_PROVIDER_SITE_OTHER): Payer: Self-pay | Admitting: *Deleted

## 2023-11-30 ENCOUNTER — Encounter: Payer: Self-pay | Admitting: Gastroenterology

## 2023-12-04 ENCOUNTER — Ambulatory Visit: Admitting: Neurology

## 2023-12-05 ENCOUNTER — Ambulatory Visit (HOSPITAL_COMMUNITY): Attending: Nurse Practitioner

## 2023-12-05 ENCOUNTER — Encounter (HOSPITAL_COMMUNITY): Payer: Self-pay

## 2023-12-05 DIAGNOSIS — M5416 Radiculopathy, lumbar region: Secondary | ICD-10-CM | POA: Insufficient documentation

## 2023-12-05 DIAGNOSIS — M6281 Muscle weakness (generalized): Secondary | ICD-10-CM | POA: Insufficient documentation

## 2023-12-05 NOTE — Therapy (Signed)
 OUTPATIENT PHYSICAL THERAPY THORACOLUMBAR EVALUATION   Patient Name: Leonard Mercer MRN: [id number] DOB:[birth year], 68 y.o., male Today's Date: 12/05/2023  END OF SESSION:  PT End of Session - 12/05/23 1032     Visit Number 1    Number of Visits 12    Date for Recertification  02/09/24    Authorization Type [employer]    Authorization Time Period seeking auth    Progress Note Due on Visit 10    PT Start Time 1033    PT Stop Time 1112    PT Time Calculation (min) 39 min    Activity Tolerance Patient tolerated treatment well    Behavior During Therapy WFL for tasks assessed/performed          Past Medical History:  Diagnosis Date   Chest pain    Complication of anesthesia    Prolonged sedation HARD TO WAKE UP   DM TYPE 2    History of COVID-19    Hypercholesteremia    Hypertension    Kidney stone    Memory disorder 11/07/2018   Parkinson's disease (HCC) 09/12/2016   Scoliosis    Severe sepsis (HCC) DUE TO UTI    IN [facility] 08-01-2022 TO 08-06-2022   Past Surgical History:  Procedure Laterality Date   CYSTOSCOPY/URETEROSCOPY/HOLMIUM LASER/STENT PLACEMENT Right 07/15/2022   Procedure: CYSTOSCOPY RIGHT URETEROSCOPY/HOLMIUM LASER/STENT PLACEMENT;  Surgeon: Carolee Sherwood JONETTA DOUGLAS, MD;  Location: [facility];  Service: Urology;  Laterality: Right;  1 HR  FOR CASE   CYSTOSCOPY/URETEROSCOPY/HOLMIUM LASER/STENT PLACEMENT Right 08/22/2022   Procedure: CYSTOSCOPY RIGHT URETEROSCOPY/STENT EXCHANGE;  Surgeon: Carolee Sherwood JONETTA DOUGLAS, MD;  Location: [facility];  Service: Urology;  Laterality: Right;  60 MINS FOR CASE   HERNIA REPAIR     YRS AGO   Patient Active Problem List   Diagnosis Date Noted   Sepsis (HCC) 08/02/2022   Lactic acidosis 08/02/2022   Nausea & vomiting 08/02/2022   Abdominal pain 08/02/2022   Dehydration 08/02/2022   Type 2 diabetes mellitus with hyperglycemia (HCC) 08/02/2022   Hypomagnesemia 08/02/2022   BPH (benign prostatic hyperplasia) 08/02/2022    Memory disorder 11/07/2018   Fatigue 11/26/2017   Parkinson disease (HCC) 09/12/2016   Chronic right shoulder pain 05/26/2015   Tremor 05/26/2015   ED (erectile dysfunction) 05/20/2015   Mixed hyperlipidemia 05/20/2015   Diabetes (HCC) 05/02/2015   Essential hypertension 05/02/2015   Constipation 07/02/2012   Post-operative state 05/29/2012    PCP: Debrah Josette ORN., PA-C  REFERRING PROVIDER: Johnson Reusing, PA-C   REFERRING DIAG: Low back pain, unspecified   Rationale for Evaluation and Treatment: Rehabilitation  THERAPY DIAG:  Radiculopathy, lumbar region  Muscle weakness (generalized)  ONSET DATE: chronic  SUBJECTIVE:  SUBJECTIVE STATEMENT: Patient reports that he has been having back pain for a while now. He thought it was kidney stones initially, but that was ruled out. He notes that his scoliosis is getting worse. He notes that rolling over is really hard and he can't bend over to pick up anything. He feels that it has slowly getting worse. He also has Parkinson's Disease. He has extreme pain with left heel strike while walking.   PERTINENT HISTORY:  HTN, diabetes, and Parkinson's Disease  PAIN:  Are you having pain? Yes: NPRS scale: Current: 5/10 Best: 0/10 Worst: 8/10 Pain location: left low back Pain description: hurts, sharp, and radiates down left leg Aggravating factors: rolling, physical activity, twisting or turning to the left Relieving factors: sitting in chair  PRECAUTIONS: None  RED FLAGS: None   WEIGHT BEARING RESTRICTIONS: No  FALLS:  Has patient fallen in last 6 months? No  LIVING ENVIRONMENT: Lives with: lives with their family Lives in: House/apartment Stairs: Yes: Internal: 15 steps; on right going up and External: 5 steps; can reach both; step to  pattern Has following equipment at home: Grab bars  OCCUPATION: retired  PLOF: Independent  PATIENT GOALS: reduced pain   NEXT MD VISIT: none scheduled  OBJECTIVE:  Note: Objective measures were completed at Evaluation unless otherwise noted.  DIAGNOSTIC FINDINGS: 07/28/23 lumbar CT scan IMPRESSION: 1. No CT evidence for acute osseous abnormality. 2. Moderate to severe levoscoliosis. Multilevel degenerative changes. 3. Aortic atherosclerosis.  PATIENT SURVEYS:  Oswestry Low Back Pain Disability Questionnaire: 19/50 or 38% disability  COGNITION: Overall cognitive status: Within functional limits for tasks assessed     SENSATION: Patient reports no numbness or tingling  POSTURE: forward head and decreased lumbar lordosis  PALPATION: TTP w/ familiar pain: right lumbar paraspinals and QL  Increased tone noted along bilateral lumbar musculature  LUMBAR JOINT MOBILITY:  Hypomobile throughout with familiar pain at L4-5  LUMBAR ROM:   AROM eval  Flexion 75% limited   Extension 75% limited; familiar low back pain  Right lateral flexion 75%; familiar pain when returning to neutral  Left lateral flexion 75% limited  Right rotation 75% limited  Left rotation 90% limited   (Blank rows = not tested)  LOWER EXTREMITY ROM:  WFL for activities assessed  LOWER EXTREMITY MMT:    MMT Right eval Left eval  Hip flexion 4/5 3/5; painful   Hip extension    Hip abduction    Hip adduction    Hip internal rotation    Hip external rotation    Knee flexion 4-/5 4-/5  Knee extension 4/5 3/5  Ankle dorsiflexion 4/5 4/5  Ankle plantarflexion    Ankle inversion    Ankle eversion     (Blank rows = not tested)  FUNCTIONAL TESTS:  5 times sit to stand: 28.72 seconds without UE support  Timed up and go (TUG): to be assessed, as able  GAIT: Assistive device utilized: None Level of assistance: Complete Independence Comments: shuffling gait pattern  TREATMENT DATE:     12/05/23:  PT evaluation with HEP provided and patient education  PATIENT EDUCATION:  Education details: HEP, objective findings, healing, prognosis, and goals for physical therapy Person educated: Patient Education method: Explanation, Demonstration, and Handouts Education comprehension: verbalized understanding and returned demonstration  HOME EXERCISE PROGRAM: Access Code: [email or web address] URL: [email or web address] Date: 12/05/2023 Prepared by: Lacinda Fass  Exercises - Seated Long Arc Quad  - 1 x daily - 7 x weekly - 2 sets - 10 reps - Seated March  - 1 x daily - 7 x weekly - 2 sets - 10 reps - Slouch Overcorrect on Swiss Ball  - 1 x daily - 7 x weekly - 2 sets - 10 reps  ASSESSMENT:  CLINICAL IMPRESSION: Patient is a 68 y.o. male who was seen today for physical therapy evaluation and treatment for left lumbar radiculopathy. He presented with moderate pain severity and irritability with lumbar extension and palpation to L4-5 and the surrounding musculature reproducing his familiar symptoms. He also exhibited gait deviations and decreased lower extremity strength. Recommend that he continue with skilled physical therapy to address his impairments to maximize his functional mobility.   OBJECTIVE IMPAIRMENTS: Abnormal gait, decreased activity tolerance, decreased mobility, difficulty walking, decreased ROM, decreased strength, hypomobility, impaired tone, postural dysfunction, and pain.   ACTIVITY LIMITATIONS: carrying, lifting, bending, sitting, standing, sleeping, stairs, transfers, bed mobility, and locomotion level  PARTICIPATION LIMITATIONS: cleaning, shopping, and community activity  PERSONAL FACTORS: Past/current experiences, Time since onset of injury/illness/exacerbation, and 3+ comorbidities: HTN, diabetes, and Parkinson's Disease are also  affecting patient's functional outcome.   REHAB POTENTIAL: Good  CLINICAL DECISION MAKING: Evolving/moderate complexity  EVALUATION COMPLEXITY: Moderate   GOALS: Goals reviewed with patient? Yes  SHORT TERM GOALS: Target date: 12/26/23  Patient will be independent with his initial HEP.  Baseline: Goal status: INITIAL  2.  Patient will be able to complete his daily activities without his familiar symptoms exceeding 6/10.  Baseline:  Goal status: INITIAL  3.  Patient will improve his ODI score to 28% disability or less for improved perceived function with his daily activities.  Baseline:  Goal status: INITIAL  LONG TERM GOALS: Target date: 01/16/24  Patient will be independent with his advanced HEP.  Baseline:  Goal status: INITIAL  2.  Patient will be able to complete his daily activities without his familiar symptoms exceeding 4/10.  Baseline:  Goal status: INITIAL  3.  Patient will be able to demonstrate proper lifting mechanics for improved function lifting objects from the floor.  Baseline:  Goal status: INITIAL  4.  Patient will improve his ODI score to 18% disability or less for improved perceived function with his daily activities.  Baseline:  Goal status: INITIAL  5.  Patient will report minimal to no difficulty with bed mobility for improved household independence.  Baseline:  Goal status: INITIAL  PLAN:  PT FREQUENCY: 2x/week  PT DURATION: 6 weeks  PLANNED INTERVENTIONS: 97164- PT Re-evaluation, 97750- Physical Performance Testing, 97110-Therapeutic exercises, 97530- Therapeutic activity, [id number]- Neuromuscular re-education, 97535- Self Care, 02859- Manual therapy, [phone]- Gait training, [phone]- Electrical stimulation (unattended), [phone]- Traction (mechanical), [phone] (1-2 muscles), [phone] (3+ muscles)- Dry Needling, Patient/Family education, Balance training, Stair training, Joint mobilization, Spinal mobilization, Cryotherapy, and Moist heat.  PLAN FOR NEXT  SESSION: manual therapy, isometrics, log rolling, and review and update HEP, as needed   Lacinda JAYSON Fass, PT 12/05/2023, 12:41 PM   [employer] Auth Request Treatment Start Date: 12/05/23  Referring diagnosis code (ICD 10)? M54.50  Treatment diagnosis codes (ICD 10)? (if different than referring diagnosis) M54.16, M62.81 What was  this (referring dx) caused by? []  Surgery []  Fall [x]  Ongoing issue []  Arthritis []  Other: ____________  Laterality: []  Rt [x]  Lt []  Both  Deficits: [x]  Pain [x]  Stiffness [x]  Weakness []  Edema [x]  Balance Deficits []  Coordination [x]  Gait Disturbance [x]  ROM []  Other   Functional Tool Score: ODI: 38% disability  CPT codes: See Planned Interventions listed in the Plan section of the Evaluation.

## 2023-12-21 ENCOUNTER — Ambulatory Visit (AMBULATORY_SURGERY_CENTER)

## 2023-12-21 VITALS — Ht 71.0 in | Wt 167.4 lb

## 2023-12-21 DIAGNOSIS — Z860100 Personal history of colon polyps, unspecified: Secondary | ICD-10-CM

## 2023-12-21 MED ORDER — NA SULFATE-K SULFATE-MG SULF 17.5-3.13-1.6 GM/177ML PO SOLN: 1.0000 | Freq: Once | ORAL | 0 refills | Status: AC

## 2023-12-21 NOTE — Progress Notes (Signed)
 No issues known to pt with past sedation with any surgeries or procedures Patient denies ever being told they had issues or difficulty with intubation  No FH of Malignant Hyperthermia Pt is not on diet pills nor GLP-1 medications Pt is not on home 02  Pt is not on blood thinners  Pt denies issues with constipation  No A fib or A flutter Have any cardiac testing pending--no Pt instructed to use [email or web address] or GoodRx for a price reduction on prep  Ambulates independently; shuffles when walking

## 2023-12-26 ENCOUNTER — Ambulatory Visit (HOSPITAL_COMMUNITY): Attending: Nurse Practitioner

## 2023-12-26 ENCOUNTER — Encounter (HOSPITAL_COMMUNITY): Payer: Self-pay

## 2023-12-26 DIAGNOSIS — M6281 Muscle weakness (generalized): Secondary | ICD-10-CM | POA: Diagnosis present

## 2023-12-26 DIAGNOSIS — M5416 Radiculopathy, lumbar region: Secondary | ICD-10-CM | POA: Insufficient documentation

## 2023-12-26 NOTE — Therapy (Signed)
 OUTPATIENT PHYSICAL THERAPY THORACOLUMBAR TREATMENT   Patient Name: Leonard Mercer MRN: [id number] DOB:1955/07/29, 68 y.o., male Today's Date: 12/26/2023  END OF SESSION:  PT End of Session - 12/26/23 1458     Visit Number 2    Number of Visits 12    Date for Recertification  02/09/24    Authorization Type [employer]    Authorization Time Period seeking auth    Progress Note Due on Visit 10    PT Start Time 1500    PT Stop Time 1541    PT Time Calculation (min) 41 min    Activity Tolerance Patient tolerated treatment well    Behavior During Therapy WFL for tasks assessed/performed           Past Medical History:  Diagnosis Date   Chest pain    Complication of anesthesia    Prolonged sedation HARD TO WAKE UP   DM TYPE 2    History of COVID-19    Hypercholesteremia    Hypertension    Kidney stone    Memory disorder 11/07/2018   Parkinson's disease (HCC) 09/12/2016   Scoliosis    Severe sepsis (HCC) DUE TO UTI    IN [facility] 08-01-2022 TO 08-06-2022   Past Surgical History:  Procedure Laterality Date   COLONOSCOPY     CYSTOSCOPY/URETEROSCOPY/HOLMIUM LASER/STENT PLACEMENT Right 07/15/2022   Procedure: CYSTOSCOPY RIGHT URETEROSCOPY/HOLMIUM LASER/STENT PLACEMENT;  Surgeon: Carolee Sherwood JONETTA DOUGLAS, MD;  Location: [facility];  Service: Urology;  Laterality: Right;  1 HR  FOR CASE   CYSTOSCOPY/URETEROSCOPY/HOLMIUM LASER/STENT PLACEMENT Right 08/22/2022   Procedure: CYSTOSCOPY RIGHT URETEROSCOPY/STENT EXCHANGE;  Surgeon: Carolee Sherwood JONETTA DOUGLAS, MD;  Location: [facility];  Service: Urology;  Laterality: Right;  60 MINS FOR CASE   HERNIA REPAIR     YRS AGO   Patient Active Problem List   Diagnosis Date Noted   Sepsis (HCC) 08/02/2022   Lactic acidosis 08/02/2022   Nausea & vomiting 08/02/2022   Abdominal pain 08/02/2022   Dehydration 08/02/2022   Type 2 diabetes mellitus with hyperglycemia (HCC) 08/02/2022   Hypomagnesemia 08/02/2022   BPH (benign prostatic  hyperplasia) 08/02/2022   Memory disorder 11/07/2018   Fatigue 11/26/2017   Parkinson disease (HCC) 09/12/2016   Chronic right shoulder pain 05/26/2015   Tremor 05/26/2015   ED (erectile dysfunction) 05/20/2015   Mixed hyperlipidemia 05/20/2015   Diabetes (HCC) 05/02/2015   Essential hypertension 05/02/2015   Constipation 07/02/2012   Post-operative state 05/29/2012    PCP: Debrah Josette ORN., PA-C  REFERRING PROVIDER: Johnson Reusing, PA-C   REFERRING DIAG: Low back pain, unspecified   Rationale for Evaluation and Treatment: Rehabilitation  THERAPY DIAG:  Radiculopathy, lumbar region  Muscle weakness (generalized)  ONSET DATE: chronic  SUBJECTIVE:  SUBJECTIVE STATEMENT: Patient reports that he is not hurting right now, but his back is stiff. He has not done his HEP because he lost the handout.   Eval: Patient reports that he has been having back pain for a while now. He thought it was kidney stones initially, but that was ruled out. He notes that his scoliosis is getting worse. He notes that rolling over is really hard and he can't bend over to pick up anything. He feels that it has slowly getting worse. He also has Parkinson's Disease. He has extreme pain with left heel strike while walking.   PERTINENT HISTORY:  HTN, diabetes, and Parkinson's Disease  PAIN:  Are you having pain? Yes: NPRS scale: Current: 0/10 Best: 0/10 Worst: 8/10 Pain location: left low back Pain description: hurts, sharp, and radiates down left leg Aggravating factors: rolling, physical activity, twisting or turning to the left Relieving factors: sitting in chair  PRECAUTIONS: None  RED FLAGS: None   WEIGHT BEARING RESTRICTIONS: No  FALLS:  Has patient fallen in last 6 months? No  LIVING  ENVIRONMENT: Lives with: lives with their family Lives in: House/apartment Stairs: Yes: Internal: 15 steps; on right going up and External: 5 steps; can reach both; step to pattern Has following equipment at home: Grab bars  OCCUPATION: retired  PLOF: Independent  PATIENT GOALS: reduced pain   NEXT MD VISIT: none scheduled  OBJECTIVE:  Note: Objective measures were completed at Evaluation unless otherwise noted.  DIAGNOSTIC FINDINGS: 07/28/23 lumbar CT scan IMPRESSION: 1. No CT evidence for acute osseous abnormality. 2. Moderate to severe levoscoliosis. Multilevel degenerative changes. 3. Aortic atherosclerosis.  PATIENT SURVEYS:  Oswestry Low Back Pain Disability Questionnaire: 19/50 or 38% disability  COGNITION: Overall cognitive status: Within functional limits for tasks assessed     SENSATION: Patient reports no numbness or tingling  POSTURE: forward head and decreased lumbar lordosis  PALPATION: TTP w/ familiar pain: right lumbar paraspinals and QL  Increased tone noted along bilateral lumbar musculature  LUMBAR JOINT MOBILITY:  Hypomobile throughout with familiar pain at L4-5  LUMBAR ROM:   AROM eval  Flexion 75% limited   Extension 75% limited; familiar low back pain  Right lateral flexion 75%; familiar pain when returning to neutral  Left lateral flexion 75% limited  Right rotation 75% limited  Left rotation 90% limited   (Blank rows = not tested)  LOWER EXTREMITY ROM:  WFL for activities assessed  LOWER EXTREMITY MMT:    MMT Right eval Left eval  Hip flexion 4/5 3/5; painful   Hip extension    Hip abduction    Hip adduction    Hip internal rotation    Hip external rotation    Knee flexion 4-/5 4-/5  Knee extension 4/5 3/5  Ankle dorsiflexion 4/5 4/5  Ankle plantarflexion    Ankle inversion    Ankle eversion     (Blank rows = not tested)  FUNCTIONAL TESTS:  5 times sit to stand: 28.72 seconds without UE support  Timed up and go (TUG):  16.47 seconds  GAIT: Assistive device utilized: None Level of assistance: Complete Independence Comments: shuffling gait pattern  TREATMENT DATE:                                      12/26/23 EXERCISE LOG  Exercise Repetitions and Resistance Comments  Nustep  L3 x 5 minutes    Lower trunk  rotation   2 minutes    Supine march  20 reps each    Supine hip ADD isometric  2 minutes w/ 5 second hold    Double knee to chest  2 minutes  With BLE supported on green ball  LAQ  2 minutes  Alternating LE   Seated slouch overcorrect  25 reps    Isometric ball press  2 minutes w/ 5 second hold  Standing; for core engagement   TUG 16.47 seconds     Blank cell = exercise not performed today   12/05/23:  PT evaluation with HEP provided and patient education                                                                                                                         PATIENT EDUCATION:  Education details: HEP and expectations for soreness  Person educated: Patient Education method: Explanation, Demonstration, and Handouts Education comprehension: verbalized understanding and returned demonstration  HOME EXERCISE PROGRAM: Access Code: [email or web address] URL: [email or web address] Date: 12/26/2023 Prepared by: Lacinda Fass  Exercises - Supine Lower Trunk Rotation  - 1 x daily - 7 x weekly - 3 sets - 10 reps - Seated Long Arc Quad  - 1 x daily - 7 x weekly - 3 sets - 10 reps - Seated March  - 1 x daily - 7 x weekly - 3 sets - 10 reps - Slouch Overcorrect on Swiss Ball  - 1 x daily - 7 x weekly - 3 sets - 10 reps  Access Code: [email or web address] URL: [email or web address] Date: 12/05/2023 Prepared by: Lacinda Fass  Exercises - Seated Long Arc Quad  - 1 x daily - 7 x weekly - 2 sets - 10 reps - Seated March  - 1 x daily - 7 x weekly - 2 sets - 10 reps - Slouch Overcorrect on Swiss Ball  - 1 x daily - 7 x weekly - 2 sets - 10 reps  ASSESSMENT:  CLINICAL  IMPRESSION: Patient was introduced to multiple new interventions for improved lumbar mobility and lower extremity strength with moderate difficulty and fatigue. His HEP was reviewed and updated today and a new handout was provided. He required minimal cueing with today's new interventions for proper exercise performance. Fatigue was his primary limitation with today's interventions as he required brief rest breaks throughout treatment. He reported feeling tired, but his back was not as tight upon the conclusion of treatment. Patient continues to require skilled physical therapy to address her remaining impairments to return to her prior level of function.    Eval: Patient is a 68 y.o. male who was seen today for physical therapy evaluation and treatment for left lumbar radiculopathy. He presented with moderate pain severity and irritability with lumbar extension and palpation to L4-5 and the surrounding musculature reproducing his familiar symptoms. He also exhibited gait deviations and decreased lower extremity strength. Recommend that he continue with skilled physical therapy to address his impairments to maximize his functional mobility.  OBJECTIVE IMPAIRMENTS: Abnormal gait, decreased activity tolerance, decreased mobility, difficulty walking, decreased ROM, decreased strength, hypomobility, impaired tone, postural dysfunction, and pain.   ACTIVITY LIMITATIONS: carrying, lifting, bending, sitting, standing, sleeping, stairs, transfers, bed mobility, and locomotion level  PARTICIPATION LIMITATIONS: cleaning, shopping, and community activity  PERSONAL FACTORS: Past/current experiences, Time since onset of injury/illness/exacerbation, and 3+ comorbidities: HTN, diabetes, and Parkinson's Disease are also affecting patient's functional outcome.   REHAB POTENTIAL: Good  CLINICAL DECISION MAKING: Evolving/moderate complexity  EVALUATION COMPLEXITY: Moderate   GOALS: Goals reviewed with patient?  Yes  SHORT TERM GOALS: Target date: 12/26/23  Patient will be independent with his initial HEP.  Baseline: Goal status: INITIAL  2.  Patient will be able to complete his daily activities without his familiar symptoms exceeding 6/10.  Baseline:  Goal status: INITIAL  3.  Patient will improve his ODI score to 28% disability or less for improved perceived function with his daily activities.  Baseline:  Goal status: INITIAL  LONG TERM GOALS: Target date: 01/16/24  Patient will be independent with his advanced HEP.  Baseline:  Goal status: INITIAL  2.  Patient will be able to complete his daily activities without his familiar symptoms exceeding 4/10.  Baseline:  Goal status: INITIAL  3.  Patient will be able to demonstrate proper lifting mechanics for improved function lifting objects from the floor.  Baseline:  Goal status: INITIAL  4.  Patient will improve his ODI score to 18% disability or less for improved perceived function with his daily activities.  Baseline:  Goal status: INITIAL  5.  Patient will report minimal to no difficulty with bed mobility for improved household independence.  Baseline:  Goal status: INITIAL  PLAN:  PT FREQUENCY: 2x/week  PT DURATION: 6 weeks  PLANNED INTERVENTIONS: 97164- PT Re-evaluation, 97750- Physical Performance Testing, 97110-Therapeutic exercises, 97530- Therapeutic activity, [id number]- Neuromuscular re-education, 97535- Self Care, 02859- Manual therapy, [phone]- Gait training, [phone]- Electrical stimulation (unattended), [phone]- Traction (mechanical), [phone] (1-2 muscles), [phone] (3+ muscles)- Dry Needling, Patient/Family education, Balance training, Stair training, Joint mobilization, Spinal mobilization, Cryotherapy, and Moist heat.  PLAN FOR NEXT SESSION: manual therapy, isometrics, log rolling, and review and update HEP, as needed   Lacinda JAYSON Fass, PT 12/26/2023, 3:46 PM

## 2023-12-29 ENCOUNTER — Encounter (HOSPITAL_COMMUNITY): Payer: Self-pay

## 2023-12-29 ENCOUNTER — Ambulatory Visit (HOSPITAL_COMMUNITY)

## 2023-12-29 DIAGNOSIS — M5416 Radiculopathy, lumbar region: Secondary | ICD-10-CM | POA: Diagnosis not present

## 2023-12-29 DIAGNOSIS — M6281 Muscle weakness (generalized): Secondary | ICD-10-CM

## 2023-12-29 NOTE — Therapy (Signed)
 OUTPATIENT PHYSICAL THERAPY THORACOLUMBAR TREATMENT   Patient Name: Leonard Mercer MRN: [id number] DOB:10/12/1955, 67 y.o., male Today's Date: 12/29/2023  END OF SESSION:  PT End of Session - 12/29/23 1110     Visit Number 3    Number of Visits 12    Date for Recertification  02/09/24    Authorization Type [employer]    Authorization Time Period seeking auth    Progress Note Due on Visit 10    PT Start Time 1110    PT Stop Time 1147   Patient requested to leave early due to fatigue.   PT Time Calculation (min) 37 min    Activity Tolerance Patient limited by fatigue    Behavior During Therapy WFL for tasks assessed/performed            Past Medical History:  Diagnosis Date   Chest pain    Complication of anesthesia    Prolonged sedation HARD TO WAKE UP   DM TYPE 2    History of COVID-19    Hypercholesteremia    Hypertension    Kidney stone    Memory disorder 11/07/2018   Parkinson's disease (HCC) 09/12/2016   Scoliosis    Severe sepsis (HCC) DUE TO UTI    IN [facility] 08-01-2022 TO 08-06-2022   Past Surgical History:  Procedure Laterality Date   COLONOSCOPY     CYSTOSCOPY/URETEROSCOPY/HOLMIUM LASER/STENT PLACEMENT Right 07/15/2022   Procedure: CYSTOSCOPY RIGHT URETEROSCOPY/HOLMIUM LASER/STENT PLACEMENT;  Surgeon: Carolee Sherwood JONETTA DOUGLAS, MD;  Location: [facility];  Service: Urology;  Laterality: Right;  1 HR  FOR CASE   CYSTOSCOPY/URETEROSCOPY/HOLMIUM LASER/STENT PLACEMENT Right 08/22/2022   Procedure: CYSTOSCOPY RIGHT URETEROSCOPY/STENT EXCHANGE;  Surgeon: Carolee Sherwood JONETTA DOUGLAS, MD;  Location: [facility];  Service: Urology;  Laterality: Right;  60 MINS FOR CASE   HERNIA REPAIR     YRS AGO   Patient Active Problem List   Diagnosis Date Noted   Sepsis (HCC) 08/02/2022   Lactic acidosis 08/02/2022   Nausea & vomiting 08/02/2022   Abdominal pain 08/02/2022   Dehydration 08/02/2022   Type 2 diabetes mellitus with hyperglycemia (HCC) 08/02/2022    Hypomagnesemia 08/02/2022   BPH (benign prostatic hyperplasia) 08/02/2022   Memory disorder 11/07/2018   Fatigue 11/26/2017   Parkinson disease (HCC) 09/12/2016   Chronic right shoulder pain 05/26/2015   Tremor 05/26/2015   ED (erectile dysfunction) 05/20/2015   Mixed hyperlipidemia 05/20/2015   Diabetes (HCC) 05/02/2015   Essential hypertension 05/02/2015   Constipation 07/02/2012   Post-operative state 05/29/2012    PCP: Debrah Josette ORN., PA-C  REFERRING PROVIDER: Johnson Reusing, PA-C   REFERRING DIAG: Low back pain, unspecified   Rationale for Evaluation and Treatment: Rehabilitation  THERAPY DIAG:  Radiculopathy, lumbar region  Muscle weakness (generalized)  ONSET DATE: chronic  SUBJECTIVE:  SUBJECTIVE STATEMENT: Patient reports that today is rough. He is stiff and sore today.   Eval: Patient reports that he has been having back pain for a while now. He thought it was kidney stones initially, but that was ruled out. He notes that his scoliosis is getting worse. He notes that rolling over is really hard and he can't bend over to pick up anything. He feels that it has slowly getting worse. He also has Parkinson's Disease. He has extreme pain with left heel strike while walking.   PERTINENT HISTORY:  HTN, diabetes, and Parkinson's Disease  PAIN:  Are you having pain? Yes: NPRS scale: Current: 3/10 Best: 0/10 Worst: 8/10 Pain location: left low back Pain description: hurts, sharp, and radiates down left leg Aggravating factors: rolling, physical activity, twisting or turning to the left Relieving factors: sitting in chair  PRECAUTIONS: None  RED FLAGS: None   WEIGHT BEARING RESTRICTIONS: No  FALLS:  Has patient fallen in last 6 months? No  LIVING ENVIRONMENT: Lives  with: lives with their family Lives in: House/apartment Stairs: Yes: Internal: 15 steps; on right going up and External: 5 steps; can reach both; step to pattern Has following equipment at home: Grab bars  OCCUPATION: retired  PLOF: Independent  PATIENT GOALS: reduced pain   NEXT MD VISIT: none scheduled  OBJECTIVE:  Note: Objective measures were completed at Evaluation unless otherwise noted.  DIAGNOSTIC FINDINGS: 07/28/23 lumbar CT scan IMPRESSION: 1. No CT evidence for acute osseous abnormality. 2. Moderate to severe levoscoliosis. Multilevel degenerative changes. 3. Aortic atherosclerosis.  PATIENT SURVEYS:  Oswestry Low Back Pain Disability Questionnaire: 19/50 or 38% disability  COGNITION: Overall cognitive status: Within functional limits for tasks assessed     SENSATION: Patient reports no numbness or tingling  POSTURE: forward head and decreased lumbar lordosis  PALPATION: TTP w/ familiar pain: right lumbar paraspinals and QL  Increased tone noted along bilateral lumbar musculature  LUMBAR JOINT MOBILITY:  Hypomobile throughout with familiar pain at L4-5  LUMBAR ROM:   AROM eval  Flexion 75% limited   Extension 75% limited; familiar low back pain  Right lateral flexion 75%; familiar pain when returning to neutral  Left lateral flexion 75% limited  Right rotation 75% limited  Left rotation 90% limited   (Blank rows = not tested)  LOWER EXTREMITY ROM:  WFL for activities assessed  LOWER EXTREMITY MMT:    MMT Right eval Left eval  Hip flexion 4/5 3/5; painful   Hip extension    Hip abduction    Hip adduction    Hip internal rotation    Hip external rotation    Knee flexion 4-/5 4-/5  Knee extension 4/5 3/5  Ankle dorsiflexion 4/5 4/5  Ankle plantarflexion    Ankle inversion    Ankle eversion     (Blank rows = not tested)  FUNCTIONAL TESTS:  5 times sit to stand: 28.72 seconds without UE support  Timed up and go (TUG): 16.47  seconds  GAIT: Assistive device utilized: None Level of assistance: Complete Independence Comments: shuffling gait pattern  TREATMENT DATE:                                      12/29/23 EXERCISE LOG  Exercise Repetitions and Resistance Comments  Nustep  L3 x 6 minutes  BUE and BLE   Rocker board  2 minutes  BUE support from parallel bars  Standing HS curl  2 minutes  Alternating LE; BUE support from parallel bars   Seated hip ADD isometric  2 minutes w/ 5 second hold    Standing hip ABD  20 reps each  BUE support from parallel bars   Static stance on foam  2 minutes  Increased sway; SBA  Resisted pull down  RTB x 2 minutes    LAQ 3# x 10 reps each     Blank cell = exercise not performed today                                    12/26/23 EXERCISE LOG  Exercise Repetitions and Resistance Comments  Nustep  L3 x 5 minutes    Lower trunk rotation   2 minutes    Supine march  20 reps each    Supine hip ADD isometric  2 minutes w/ 5 second hold    Double knee to chest  2 minutes  With BLE supported on green ball  LAQ  2 minutes  Alternating LE   Seated slouch overcorrect  25 reps    Isometric ball press  2 minutes w/ 5 second hold  Standing; for core engagement   TUG 16.47 seconds     Blank cell = exercise not performed today   12/05/23:  PT evaluation with HEP provided and patient education                                                                                                                         PATIENT EDUCATION:  Education details: HEP and expectations for soreness  Person educated: Patient Education method: Programmer, Multimedia, Demonstration, and Handouts Education comprehension: verbalized understanding and returned demonstration  HOME EXERCISE PROGRAM: Access Code: [email or web address] URL: [email or web address] Date: 12/26/2023 Prepared by: Lacinda Fass  Exercises - Supine Lower Trunk Rotation  - 1 x daily - 7 x weekly - 3 sets - 10 reps - Seated Long Arc  Quad  - 1 x daily - 7 x weekly - 3 sets - 10 reps - Seated March  - 1 x daily - 7 x weekly - 3 sets - 10 reps - Slouch Overcorrect on Swiss Ball  - 1 x daily - 7 x weekly - 3 sets - 10 reps  Access Code: [email or web address] URL: [email or web address] Date: 12/05/2023 Prepared by: Lacinda Fass  Exercises - Seated Long Arc Quad  - 1 x daily - 7 x weekly - 2 sets - 10 reps - Seated March  - 1 x daily - 7 x weekly - 2 sets - 10 reps - Slouch Overcorrect on Swiss Ball  - 1 x daily - 7 x weekly - 2 sets - 10 reps  ASSESSMENT:  CLINICAL IMPRESSION: Today's treatment focused primarily on standing and sitting interventions for improved lower extremity strength and stability with moderate difficulty and fatigue. He required minimal cueing with today's new interventions for  proper exercise performance. Fatigue was his primary limitation with today's interventions as he required brief seated rest breaks throughout today's treatment. He was also unable to complete his full scheduled appointment secondary to fatigue. He reported a mild increase in aching in his low back, but reduced stiffness upon the conclusion of treatment. Patient continues to require skilled physical therapy to address her remaining impairments to return to her prior level of function.     Eval: Patient is a 68 y.o. male who was seen today for physical therapy evaluation and treatment for left lumbar radiculopathy. He presented with moderate pain severity and irritability with lumbar extension and palpation to L4-5 and the surrounding musculature reproducing his familiar symptoms. He also exhibited gait deviations and decreased lower extremity strength. Recommend that he continue with skilled physical therapy to address his impairments to maximize his functional mobility.   OBJECTIVE IMPAIRMENTS: Abnormal gait, decreased activity tolerance, decreased mobility, difficulty walking, decreased ROM, decreased strength, hypomobility,  impaired tone, postural dysfunction, and pain.   ACTIVITY LIMITATIONS: carrying, lifting, bending, sitting, standing, sleeping, stairs, transfers, bed mobility, and locomotion level  PARTICIPATION LIMITATIONS: cleaning, shopping, and community activity  PERSONAL FACTORS: Past/current experiences, Time since onset of injury/illness/exacerbation, and 3+ comorbidities: HTN, diabetes, and Parkinson's Disease are also affecting patient's functional outcome.   REHAB POTENTIAL: Good  CLINICAL DECISION MAKING: Evolving/moderate complexity  EVALUATION COMPLEXITY: Moderate   GOALS: Goals reviewed with patient? Yes  SHORT TERM GOALS: Target date: 12/26/23  Patient will be independent with his initial HEP.  Baseline: Goal status: INITIAL  2.  Patient will be able to complete his daily activities without his familiar symptoms exceeding 6/10.  Baseline:  Goal status: INITIAL  3.  Patient will improve his ODI score to 28% disability or less for improved perceived function with his daily activities.  Baseline:  Goal status: INITIAL  LONG TERM GOALS: Target date: 01/16/24  Patient will be independent with his advanced HEP.  Baseline:  Goal status: INITIAL  2.  Patient will be able to complete his daily activities without his familiar symptoms exceeding 4/10.  Baseline:  Goal status: INITIAL  3.  Patient will be able to demonstrate proper lifting mechanics for improved function lifting objects from the floor.  Baseline:  Goal status: INITIAL  4.  Patient will improve his ODI score to 18% disability or less for improved perceived function with his daily activities.  Baseline:  Goal status: INITIAL  5.  Patient will report minimal to no difficulty with bed mobility for improved household independence.  Baseline:  Goal status: INITIAL  PLAN:  PT FREQUENCY: 2x/week  PT DURATION: 6 weeks  PLANNED INTERVENTIONS: 97164- PT Re-evaluation, 97750- Physical Performance Testing,  97110-Therapeutic exercises, 97530- Therapeutic activity, [id number]- Neuromuscular re-education, 97535- Self Care, [id number]- Manual therapy, [phone]- Gait training, [phone]- Electrical stimulation (unattended), [phone]- Traction (mechanical), [phone] (1-2 muscles), [phone] (3+ muscles)- Dry Needling, Patient/Family education, Balance training, Stair training, Joint mobilization, Spinal mobilization, Cryotherapy, and Moist heat.  PLAN FOR NEXT SESSION: manual therapy, isometrics, log rolling, and review and update HEP, as needed   Lacinda JAYSON Fass, PT 12/29/2023, 12:08 PM

## 2024-01-01 ENCOUNTER — Encounter: Payer: Self-pay | Admitting: Gastroenterology

## 2024-01-02 ENCOUNTER — Encounter (HOSPITAL_COMMUNITY): Payer: Self-pay

## 2024-01-02 ENCOUNTER — Ambulatory Visit (HOSPITAL_COMMUNITY)

## 2024-01-02 DIAGNOSIS — M5416 Radiculopathy, lumbar region: Secondary | ICD-10-CM

## 2024-01-02 DIAGNOSIS — M6281 Muscle weakness (generalized): Secondary | ICD-10-CM

## 2024-01-02 NOTE — Therapy (Signed)
 OUTPATIENT PHYSICAL THERAPY THORACOLUMBAR TREATMENT   Patient Name: MAXI RODAS MRN: [id number] DOB:1955-07-10, 68 y.o., male Today's Date: 01/02/2024  END OF SESSION:  PT End of Session - 01/02/24 1401     Visit Number 4    Number of Visits 12    Date for Recertification  02/09/24    Authorization Type [employer]    Authorization Time Period seeking auth    Progress Note Due on Visit 10    PT Start Time 1402    PT Stop Time 1442    PT Time Calculation (min) 40 min    Activity Tolerance Patient tolerated treatment well    Behavior During Therapy WFL for tasks assessed/performed             Past Medical History:  Diagnosis Date   Chest pain    Complication of anesthesia    Prolonged sedation HARD TO WAKE UP   DM TYPE 2    History of COVID-19    Hypercholesteremia    Hypertension    Kidney stone    Memory disorder 11/07/2018   Parkinson's disease (HCC) 09/12/2016   Scoliosis    Severe sepsis (HCC) DUE TO UTI    IN [facility] 08-01-2022 TO 08-06-2022   Past Surgical History:  Procedure Laterality Date   COLONOSCOPY     CYSTOSCOPY/URETEROSCOPY/HOLMIUM LASER/STENT PLACEMENT Right 07/15/2022   Procedure: CYSTOSCOPY RIGHT URETEROSCOPY/HOLMIUM LASER/STENT PLACEMENT;  Surgeon: Carolee Sherwood JONETTA DOUGLAS, MD;  Location: [facility];  Service: Urology;  Laterality: Right;  1 HR  FOR CASE   CYSTOSCOPY/URETEROSCOPY/HOLMIUM LASER/STENT PLACEMENT Right 08/22/2022   Procedure: CYSTOSCOPY RIGHT URETEROSCOPY/STENT EXCHANGE;  Surgeon: Carolee Sherwood JONETTA DOUGLAS, MD;  Location: [facility];  Service: Urology;  Laterality: Right;  60 MINS FOR CASE   HERNIA REPAIR     YRS AGO   Patient Active Problem List   Diagnosis Date Noted   Sepsis (HCC) 08/02/2022   Lactic acidosis 08/02/2022   Nausea & vomiting 08/02/2022   Abdominal pain 08/02/2022   Dehydration 08/02/2022   Type 2 diabetes mellitus with hyperglycemia (HCC) 08/02/2022   Hypomagnesemia 08/02/2022   BPH (benign prostatic  hyperplasia) 08/02/2022   Memory disorder 11/07/2018   Fatigue 11/26/2017   Parkinson disease (HCC) 09/12/2016   Chronic right shoulder pain 05/26/2015   Tremor 05/26/2015   ED (erectile dysfunction) 05/20/2015   Mixed hyperlipidemia 05/20/2015   Diabetes (HCC) 05/02/2015   Essential hypertension 05/02/2015   Constipation 07/02/2012   Post-operative state 05/29/2012    PCP: Debrah Josette ORN., PA-C  REFERRING PROVIDER: Johnson Reusing, PA-C   REFERRING DIAG: Low back pain, unspecified   Rationale for Evaluation and Treatment: Rehabilitation  THERAPY DIAG:  Radiculopathy, lumbar region  Muscle weakness (generalized)  ONSET DATE: chronic  SUBJECTIVE:  SUBJECTIVE STATEMENT: Patient reports that he is hurting and stiff today, but not as bad as it has been.   Eval: Patient reports that he has been having back pain for a while now. He thought it was kidney stones initially, but that was ruled out. He notes that his scoliosis is getting worse. He notes that rolling over is really hard and he can't bend over to pick up anything. He feels that it has slowly getting worse. He also has Parkinson's Disease. He has extreme pain with left heel strike while walking.   PERTINENT HISTORY:  HTN, diabetes, and Parkinson's Disease  PAIN:  Are you having pain? Yes: NPRS scale: Current: 3/10 Best: 0/10 Worst: 8/10 Pain location: left low back Pain description: hurts, sharp, and radiates down left leg Aggravating factors: rolling, physical activity, twisting or turning to the left Relieving factors: sitting in chair  PRECAUTIONS: None  RED FLAGS: None   WEIGHT BEARING RESTRICTIONS: No  FALLS:  Has patient fallen in last 6 months? No  LIVING ENVIRONMENT: Lives with: lives with their family Lives  in: House/apartment Stairs: Yes: Internal: 15 steps; on right going up and External: 5 steps; can reach both; step to pattern Has following equipment at home: Grab bars  OCCUPATION: retired  PLOF: Independent  PATIENT GOALS: reduced pain   NEXT MD VISIT: none scheduled  OBJECTIVE:  Note: Objective measures were completed at Evaluation unless otherwise noted.  DIAGNOSTIC FINDINGS: 07/28/23 lumbar CT scan IMPRESSION: 1. No CT evidence for acute osseous abnormality. 2. Moderate to severe levoscoliosis. Multilevel degenerative changes. 3. Aortic atherosclerosis.  PATIENT SURVEYS:  Oswestry Low Back Pain Disability Questionnaire: 19/50 or 38% disability  COGNITION: Overall cognitive status: Within functional limits for tasks assessed     SENSATION: Patient reports no numbness or tingling  POSTURE: forward head and decreased lumbar lordosis  PALPATION: TTP w/ familiar pain: right lumbar paraspinals and QL  Increased tone noted along bilateral lumbar musculature  LUMBAR JOINT MOBILITY:  Hypomobile throughout with familiar pain at L4-5  LUMBAR ROM:   AROM eval  Flexion 75% limited   Extension 75% limited; familiar low back pain  Right lateral flexion 75%; familiar pain when returning to neutral  Left lateral flexion 75% limited  Right rotation 75% limited  Left rotation 90% limited   (Blank rows = not tested)  LOWER EXTREMITY ROM:  WFL for activities assessed  LOWER EXTREMITY MMT:    MMT Right eval Left eval  Hip flexion 4/5 3/5; painful   Hip extension    Hip abduction    Hip adduction    Hip internal rotation    Hip external rotation    Knee flexion 4-/5 4-/5  Knee extension 4/5 3/5  Ankle dorsiflexion 4/5 4/5  Ankle plantarflexion    Ankle inversion    Ankle eversion     (Blank rows = not tested)  FUNCTIONAL TESTS:  5 times sit to stand: 28.72 seconds without UE support  Timed up and go (TUG): 16.47 seconds  GAIT: Assistive device utilized:  None Level of assistance: Complete Independence Comments: shuffling gait pattern  TREATMENT DATE:                                      01/02/24 EXERCISE LOG  Exercise Repetitions and Resistance Comments  Nustep  L3 x 6 minutes  BUE and BLE   Standing hip extension   2 minutes  Alternating LE; BUE support from parallel bars   Standing march  2 minutes  BUE support from parallel bars  Standing hip ABD  2 minutes  Alternating LE; BUE support from parallel bars   Mini squat  15 reps  BUE support from parallel bars   Rocker board  2 minutes  BUE support on parallel bars   Resisted row   RTB x 2 minutes    Resisted pull down  RTB x 2 minutes    Pallof press  RTB x 2 minutes each    Standing gastroc stretch  3 x 30 seconds    Blank cell = exercise not performed today                                    12/29/23 EXERCISE LOG  Exercise Repetitions and Resistance Comments  Nustep  L3 x 6 minutes  BUE and BLE   Rocker board  2 minutes  BUE support from parallel bars  Standing HS curl  2 minutes  Alternating LE; BUE support from parallel bars   Seated hip ADD isometric  2 minutes w/ 5 second hold    Standing hip ABD  20 reps each  BUE support from parallel bars   Static stance on foam  2 minutes  Increased sway; SBA  Resisted pull down  RTB x 2 minutes    LAQ 3# x 10 reps each     Blank cell = exercise not performed today                                    12/26/23 EXERCISE LOG  Exercise Repetitions and Resistance Comments  Nustep  L3 x 5 minutes    Lower trunk rotation   2 minutes    Supine march  20 reps each    Supine hip ADD isometric  2 minutes w/ 5 second hold    Double knee to chest  2 minutes  With BLE supported on green ball  LAQ  2 minutes  Alternating LE   Seated slouch overcorrect  25 reps    Isometric ball press  2 minutes w/ 5 second hold  Standing; for core engagement   TUG 16.47 seconds     Blank cell = exercise not performed today   PATIENT EDUCATION:  Education  details: HEP and expectations for soreness  Person educated: Patient Education method: Explanation, Demonstration, and Handouts Education comprehension: verbalized understanding and returned demonstration  HOME EXERCISE PROGRAM: Access Code: [email or web address] URL: [email or web address] Date: 12/26/2023 Prepared by: Lacinda Fass  Exercises - Supine Lower Trunk Rotation  - 1 x daily - 7 x weekly - 3 sets - 10 reps - Seated Long Arc Quad  - 1 x daily - 7 x weekly - 3 sets - 10 reps - Seated March  - 1 x daily - 7 x weekly - 3 sets - 10 reps - Slouch Overcorrect on Swiss Ball  - 1 x daily - 7 x weekly - 3 sets - 10 reps  Access Code: [email or web address] URL: [email or web address] Date: 12/05/2023 Prepared by: Lacinda Fass  Exercises - Seated Long Arc Quad  - 1 x daily - 7 x weekly - 2 sets - 10 reps - Seated March  - 1 x daily - 7 x weekly - 2 sets - 10 reps - Slouch Overcorrect  on [employer]  - 1 x daily - 7 x weekly - 2 sets - 10 reps  ASSESSMENT:  CLINICAL IMPRESSION: Patient was progressed with multiple new standing interventions for improved standing tolerance needed for his daily activities. He required minimal cueing with today's new standing interventions for proper exercise performance to facilitate improved lumbar mobility. He experienced a mild increase in fatigue as today's treatment progressed, but this did not inhibit his ability to complete any of today's interventions. He reported feeling tired upon the conclusion of treatment. Patient continues to require skilled physical therapy to address her remaining impairments to maximize his safety and functional mobility.   Eval: Patient is a 68 y.o. male who was seen today for physical therapy evaluation and treatment for left lumbar radiculopathy. He presented with moderate pain severity and irritability with lumbar extension and palpation to L4-5 and the surrounding musculature reproducing his familiar symptoms. He  also exhibited gait deviations and decreased lower extremity strength. Recommend that he continue with skilled physical therapy to address his impairments to maximize his functional mobility.   OBJECTIVE IMPAIRMENTS: Abnormal gait, decreased activity tolerance, decreased mobility, difficulty walking, decreased ROM, decreased strength, hypomobility, impaired tone, postural dysfunction, and pain.   ACTIVITY LIMITATIONS: carrying, lifting, bending, sitting, standing, sleeping, stairs, transfers, bed mobility, and locomotion level  PARTICIPATION LIMITATIONS: cleaning, shopping, and community activity  PERSONAL FACTORS: Past/current experiences, Time since onset of injury/illness/exacerbation, and 3+ comorbidities: HTN, diabetes, and Parkinson's Disease are also affecting patient's functional outcome.   REHAB POTENTIAL: Good  CLINICAL DECISION MAKING: Evolving/moderate complexity  EVALUATION COMPLEXITY: Moderate   GOALS: Goals reviewed with patient? Yes  SHORT TERM GOALS: Target date: 12/26/23  Patient will be independent with his initial HEP.  Baseline: Goal status: INITIAL  2.  Patient will be able to complete his daily activities without his familiar symptoms exceeding 6/10.  Baseline:  Goal status: INITIAL  3.  Patient will improve his ODI score to 28% disability or less for improved perceived function with his daily activities.  Baseline:  Goal status: INITIAL  LONG TERM GOALS: Target date: 01/16/24  Patient will be independent with his advanced HEP.  Baseline:  Goal status: INITIAL  2.  Patient will be able to complete his daily activities without his familiar symptoms exceeding 4/10.  Baseline:  Goal status: INITIAL  3.  Patient will be able to demonstrate proper lifting mechanics for improved function lifting objects from the floor.  Baseline:  Goal status: INITIAL  4.  Patient will improve his ODI score to 18% disability or less for improved perceived function with  his daily activities.  Baseline:  Goal status: INITIAL  5.  Patient will report minimal to no difficulty with bed mobility for improved household independence.  Baseline:  Goal status: INITIAL  PLAN:  PT FREQUENCY: 2x/week  PT DURATION: 6 weeks  PLANNED INTERVENTIONS: 97164- PT Re-evaluation, 97750- Physical Performance Testing, 97110-Therapeutic exercises, 97530- Therapeutic activity, [id number]- Neuromuscular re-education, 97535- Self Care, [id number]- Manual therapy, [phone]- Gait training, [phone]- Electrical stimulation (unattended), [phone]- Traction (mechanical), [phone] (1-2 muscles), [phone] (3+ muscles)- Dry Needling, Patient/Family education, Balance training, Stair training, Joint mobilization, Spinal mobilization, Cryotherapy, and Moist heat.  PLAN FOR NEXT SESSION: manual therapy, isometrics, log rolling, and review and update HEP, as needed   Lacinda JAYSON Fass, PT 01/02/2024, 2:54 PM

## 2024-01-04 ENCOUNTER — Encounter: Payer: Self-pay | Admitting: Gastroenterology

## 2024-01-04 ENCOUNTER — Ambulatory Visit: Admitting: Gastroenterology

## 2024-01-04 VITALS — BP 127/78 | HR 63 | Temp 98.2°F | Resp 10 | Ht 71.0 in | Wt 167.4 lb

## 2024-01-04 DIAGNOSIS — Z860100 Personal history of colon polyps, unspecified: Secondary | ICD-10-CM | POA: Diagnosis not present

## 2024-01-04 DIAGNOSIS — D125 Benign neoplasm of sigmoid colon: Secondary | ICD-10-CM | POA: Diagnosis not present

## 2024-01-04 DIAGNOSIS — D122 Benign neoplasm of ascending colon: Secondary | ICD-10-CM

## 2024-01-04 DIAGNOSIS — Z1211 Encounter for screening for malignant neoplasm of colon: Secondary | ICD-10-CM | POA: Diagnosis not present

## 2024-01-04 DIAGNOSIS — K5730 Diverticulosis of large intestine without perforation or abscess without bleeding: Secondary | ICD-10-CM

## 2024-01-04 MED ORDER — SODIUM CHLORIDE 0.9 % IV SOLN: 500.0000 mL | INTRAVENOUS | Status: DC

## 2024-01-04 NOTE — Op Note (Signed)
 [facility] Patient Name: Leonard Mercer Procedure Date: 01/04/2024 9:33 AM MRN: [id number] Endoscopist: Glendia E. Stacia , MD, [id number] Age: 68 Referring MD:  Date of Birth: September 01, 1955 Gender: Male Account #: [id number] Procedure:                Colonoscopy Indications:              High risk colon cancer surveillance: Personal                            history of colonic polyps Medicines:                Monitored Anesthesia Care Procedure:                Pre-Anesthesia Assessment:                           - Prior to the procedure, a History and Physical                            was performed, and patient medications and                            allergies were reviewed. The patient's tolerance of                            previous anesthesia was also reviewed. The risks                            and benefits of the procedure and the sedation                            options and risks were discussed with the patient.                            All questions were answered, and informed consent                            was obtained. Prior Anticoagulants: The patient has                            taken no anticoagulant or antiplatelet agents. ASA                            Grade Assessment: II - A patient with mild systemic                            disease. After reviewing the risks and benefits,                            the patient was deemed in satisfactory condition to                            undergo the procedure.  After obtaining informed consent, the colonoscope                            was passed under direct vision. Throughout the                            procedure, the patient's blood pressure, pulse, and                            oxygen saturations were monitored continuously. The                            CF [id number] #[id number] was introduced through the anus                            and advanced to the the  terminal ileum, with                            identification of the appendiceal orifice and IC                            valve. The colonoscopy was performed without                            difficulty. The patient tolerated the procedure                            well. The quality of the bowel preparation was                            good. The terminal ileum, ileocecal valve,                            appendiceal orifice, and rectum were photographed.                            The bowel preparation used was SUPREP via split                            dose instruction. Scope In: 9:55:53 AM Scope Out: 10:17:51 AM Scope Withdrawal Time: 0 hours 16 minutes 46 seconds  Total Procedure Duration: 0 hours 21 minutes 58 seconds  Findings:                 The perianal and digital rectal examinations were                            normal. Pertinent negatives include normal                            sphincter tone and no palpable rectal lesions.                           Five sessile polyps were found in the ascending  colon. The polyps were 4 to 12 mm in size. These                            polyps were removed with a cold snare. Resection                            and retrieval were complete. Estimated blood loss                            was minimal.                           A 10 mm polyp was found in the sigmoid colon. The                            polyp was sessile. The polyp was removed with a                            cold snare. Resection and retrieval were complete.                            Estimated blood loss was minimal.                           Multiple small-mouthed diverticula were found in                            the sigmoid colon.                           The exam was otherwise normal throughout the                            examined colon.                           The terminal ileum appeared normal.                            The retroflexed view of the distal rectum and anal                            verge was normal and showed no anal or rectal                            abnormalities. Complications:            No immediate complications. Estimated Blood Loss:     Estimated blood loss was minimal. Impression:               - Five 4 to 12 mm polyps in the ascending colon,                            removed with a cold snare. Resected and retrieved.                           -  One 10 mm polyp in the sigmoid colon, removed                            with a cold snare. Resected and retrieved.                           - Mild diverticulosis in the sigmoid colon.                           - The examined portion of the ileum was normal.                           - The distal rectum and anal verge are normal on                            retroflexion view. Recommendation:           - Patient has a contact number available for                            emergencies. The signs and symptoms of potential                            delayed complications were discussed with the                            patient. Return to normal activities tomorrow.                            Written discharge instructions were provided to the                            patient.                           - Resume previous diet.                           - Continue present medications.                           - Await pathology results.                           - Repeat colonoscopy (date not yet determined) for                            surveillance based on pathology results. Baltasar Twilley E. Stacia, MD 01/04/2024 89:71:71 AM This report has been signed electronically.

## 2024-01-04 NOTE — Progress Notes (Signed)
 [facility] History and Physical   Primary Care Physician:  Debrah Josette ORN., PA-C   Reason for Procedure:   Colon cancer screening/history of polyps  Plan:    Colonoscopy   HPI: Leonard Mercer is a 68 y.o. male undergoing surveillance colonoscopy.  He has no family history of colon cancer and no chronic GI symptoms.  He reports having a colonoscopy over 10 years ago and says that a polyp was removed. No records available for review.  The patient was provided an opportunity to ask questions and all were answered. The patient agreed with the plan    Past Medical History:  Diagnosis Date   Chest pain    Complication of anesthesia    Prolonged sedation HARD TO WAKE UP   DM TYPE 2    History of COVID-19    Hypercholesteremia    Hypertension    Kidney stone    Memory disorder 11/07/2018   Parkinson's disease (HCC) 09/12/2016   Scoliosis    Severe sepsis (HCC) DUE TO UTI    IN [facility] 08-01-2022 TO 08-06-2022    Past Surgical History:  Procedure Laterality Date   COLONOSCOPY     CYSTOSCOPY/URETEROSCOPY/HOLMIUM LASER/STENT PLACEMENT Right 07/15/2022   Procedure: CYSTOSCOPY RIGHT URETEROSCOPY/HOLMIUM LASER/STENT PLACEMENT;  Surgeon: Carolee Sherwood JONETTA DOUGLAS, MD;  Location: [facility];  Service: Urology;  Laterality: Right;  1 HR  FOR CASE   CYSTOSCOPY/URETEROSCOPY/HOLMIUM LASER/STENT PLACEMENT Right 08/22/2022   Procedure: CYSTOSCOPY RIGHT URETEROSCOPY/STENT EXCHANGE;  Surgeon: Carolee Sherwood JONETTA DOUGLAS, MD;  Location: [facility];  Service: Urology;  Laterality: Right;  60 MINS FOR CASE   HERNIA REPAIR     YRS AGO    Prior to Admission medications   Medication Sig Start Date End Date Taking? Authorizing Provider  carbidopa -levodopa  (SINEMET  CR) 50-200 MG tablet Take 1 tablet by mouth 3 (three) times daily. 10/09/23  Yes McCue, Harlene, NP  carbidopa -levodopa  (SINEMET  IR) 25-100 MG tablet Take 2 tablets by mouth 4 (four) times daily. 06/05/23  Yes McCue, Harlene, NP   escitalopram  (LEXAPRO ) 20 MG tablet Take 20 mg by mouth daily. 05/24/23  Yes [provider]  glimepiride (AMARYL) 1 MG tablet Take 1 mg by mouth daily.   Yes [provider]  losartan  (COZAAR ) 50 MG tablet Take 50 mg by mouth daily with lunch. 08/21/20  Yes [provider]  memantine  (NAMENDA ) 10 MG tablet Take 1 tablet (10 mg total) by mouth 2 (two) times daily. 10/03/22  Yes McCue, Harlene, NP  metFORMIN (GLUCOPHAGE) 1000 MG tablet Take 1,000 mg by mouth 2 (two) times daily with a meal.   Yes [provider]  rosuvastatin  (CRESTOR ) 10 MG tablet Take 10 mg by mouth every evening. 09/02/21  Yes [provider]  lidocaine  (LIDODERM ) 5 % Place 1 patch onto the skin daily. Remove & Discard patch within 12 hours or as directed by MD Patient not taking: No sig reported 07/29/23   Palumbo, April, MD  Melatonin 10 MG TABS Take 10 mg by mouth at bedtime as needed (sleep.).    [provider]  naproxen  (NAPROSYN ) 500 MG tablet Take 1 tablet (500 mg total) by mouth 2 (two) times daily with a meal. Patient taking differently: Take 500 mg by mouth at bedtime. 07/29/23   Palumbo, April, MD  ondansetron  (ZOFRAN -ODT) 4 MG disintegrating tablet DISSOLVE 1 TABLET ON THE TONGUE EVERY 8 HOURS FOR 7 DAYS    [provider]  polyethylene glycol (MIRALAX  / GLYCOLAX ) 17  g packet Take 17 g by mouth daily as needed (constipation.).    [provider]  tamsulosin  (FLOMAX ) 0.4 MG CAPS capsule Take 1 capsule (0.4 mg total) by mouth daily after supper. Patient not taking: No sig reported 07/16/22   Midge Golas, MD    Current Outpatient Medications  Medication Sig Dispense Refill   carbidopa -levodopa  (SINEMET  CR) 50-200 MG tablet Take 1 tablet by mouth 3 (three) times daily. 270 tablet 3   carbidopa -levodopa  (SINEMET  IR) 25-100 MG tablet Take 2 tablets by mouth 4 (four) times daily. 240 tablet 11   escitalopram  (LEXAPRO ) 20 MG tablet Take 20 mg by mouth  daily.     glimepiride (AMARYL) 1 MG tablet Take 1 mg by mouth daily.     losartan  (COZAAR ) 50 MG tablet Take 50 mg by mouth daily with lunch.     memantine  (NAMENDA ) 10 MG tablet Take 1 tablet (10 mg total) by mouth 2 (two) times daily. 180 tablet 3   metFORMIN (GLUCOPHAGE) 1000 MG tablet Take 1,000 mg by mouth 2 (two) times daily with a meal.     rosuvastatin  (CRESTOR ) 10 MG tablet Take 10 mg by mouth every evening.     lidocaine  (LIDODERM ) 5 % Place 1 patch onto the skin daily. Remove & Discard patch within 12 hours or as directed by MD (Patient not taking: No sig reported) 30 patch 0   Melatonin 10 MG TABS Take 10 mg by mouth at bedtime as needed (sleep.).     naproxen  (NAPROSYN ) 500 MG tablet Take 1 tablet (500 mg total) by mouth 2 (two) times daily with a meal. (Patient taking differently: Take 500 mg by mouth at bedtime.) 14 tablet 0   ondansetron  (ZOFRAN -ODT) 4 MG disintegrating tablet DISSOLVE 1 TABLET ON THE TONGUE EVERY 8 HOURS FOR 7 DAYS     polyethylene glycol (MIRALAX  / GLYCOLAX ) 17 g packet Take 17 g by mouth daily as needed (constipation.).     tamsulosin  (FLOMAX ) 0.4 MG CAPS capsule Take 1 capsule (0.4 mg total) by mouth daily after supper. (Patient not taking: No sig reported) 7 capsule 0   Current Facility-Administered Medications  Medication Dose Route Frequency Provider Last Rate Last Admin   0.9 %  sodium chloride  infusion  500 mL Intravenous Continuous Stacia Glendia BRAVO, MD        Allergies as of 01/04/2024 - Review Complete 01/04/2024  Allergen Reaction Noted   Ace inhibitors Other (See Comments) 05/02/2015   Donepezil  hcl Other (See Comments) 11/14/2018   Sulfamethoxazole-trimethoprim Nausea And Vomiting 12/23/2019   Pravastatin Rash 06/03/2019    Family History  Problem Relation Age of Onset   Hypertension Mother    Diabetes Father    Heart disease Maternal Grandfather    Parkinson's disease Neg Hx    Colon cancer Neg Hx    Esophageal cancer Neg Hx     Rectal cancer Neg Hx    Stomach cancer Neg Hx    Colon polyps Neg Hx     Social History   Socioeconomic History   Marital status: Married    Spouse name: Particia   Number of children: 1   Years of education: 18   Highest education level: Not on file  Occupational History   Occupation: [employer]  Tobacco Use   Smoking status: Never   Smokeless tobacco: Never  Vaping Use   Vaping status: Never Used  Substance and Sexual Activity   Alcohol use: Yes    Comment: once  every couple of weeks   Drug use: No   Sexual activity: Not on file  Other Topics Concern   Not on file  Social History Narrative   Lives with wife and grown son   Caffeine use: Diet sodas   Right handed   Social Drivers of Health   Financial Resource Strain: Not on file  Food Insecurity: No Food Insecurity (08/02/2022)   Hunger Vital Sign    Worried About Running Out of Food in the Last Year: Never true    Ran Out of Food in the Last Year: Never true  Transportation Needs: No Transportation Needs (08/02/2022)   PRAPARE - Administrator, Civil Service (Medical): No    Lack of Transportation (Non-Medical): No  Physical Activity: Not on file  Stress: Not on file  Social Connections: Not on file  Intimate Partner Violence: Not At Risk (08/02/2022)   Humiliation, Afraid, Rape, and Kick questionnaire    Fear of Current or Ex-Partner: No    Emotionally Abused: No    Physically Abused: No    Sexually Abused: No    Review of Systems:  All other review of systems negative except as mentioned in the HPI.  Physical Exam: Vital signs BP 119/80   Pulse 77   Temp 98.2 F (36.8 C) (Temporal)   Ht 5' 11 (1.803 m)   Wt 167 lb 6.4 oz (75.9 kg)   SpO2 92%   BMI 23.35 kg/m   General:   Alert,  Well-developed, well-nourished, pleasant and cooperative in NAD Airway:  Mallampati 3 Lungs:  Clear throughout to auscultation.   Heart:  Regular rate and rhythm; no murmurs, clicks, rubs,   or gallops. Abdomen:  Soft, nontender and nondistended. Normal bowel sounds.   Neuro/Psych:  Normal mood and affect. A and O x 3.  Resting tremor noted   Kyllie Pettijohn E. Stacia, MD [facility]

## 2024-01-04 NOTE — Progress Notes (Signed)
 Called to room to assist during endoscopic procedure.  Patient ID and intended procedure confirmed with present staff. Received instructions for my participation in the procedure from the performing physician.

## 2024-01-04 NOTE — Progress Notes (Signed)
 To pacu, VSS. Report to [email or web address]

## 2024-01-04 NOTE — Patient Instructions (Signed)
 Resume previous diet.                           - Continue present medications.                           - Await pathology results.                           - Repeat colonoscopy (date not yet determined) for                            surveillance based on pathology results. Handout on polyps and diverticulosis given.     YOU HAD AN ENDOSCOPIC PROCEDURE TODAY AT THE  ENDOSCOPY CENTER:   Refer to the procedure report that was given to you for any specific questions about what was found during the examination.  If the procedure report does not answer your questions, please call your gastroenterologist to clarify.  If you requested that your care partner not be given the details of your procedure findings, then the procedure report has been included in a sealed envelope for you to review at your convenience later.  YOU SHOULD EXPECT: Some feelings of bloating in the abdomen. Passage of more gas than usual.  Walking can help get rid of the air that was put into your GI tract during the procedure and reduce the bloating. If you had a lower endoscopy (such as a colonoscopy or flexible sigmoidoscopy) you may notice spotting of blood in your stool or on the toilet paper. If you underwent a bowel prep for your procedure, you may not have a normal bowel movement for a few days.  Please Note:  You might notice some irritation and congestion in your nose or some drainage.  This is from the oxygen used during your procedure.  There is no need for concern and it should clear up in a day or so.  SYMPTOMS TO REPORT IMMEDIATELY:  Following lower endoscopy (colonoscopy or flexible sigmoidoscopy):  Excessive amounts of blood in the stool  Significant tenderness or worsening of abdominal pains  Swelling of the abdomen that is new, acute  Fever of 100F or higher   For urgent or emergent issues, a gastroenterologist can be reached at any hour by calling [phone]. Do not use MyChart messaging for  urgent concerns.    DIET:  We do recommend a small meal at first, but then you may proceed to your regular diet.  Drink plenty of fluids but you should avoid alcoholic beverages for 24 hours.  ACTIVITY:  You should plan to take it easy for the rest of today and you should NOT DRIVE or use heavy machinery until tomorrow (because of the sedation medicines used during the test).    FOLLOW UP: Our staff will call the number listed on your records the next business day following your procedure.  We will call around 7:15- 8:00 am to check on you and address any questions or concerns that you may have regarding the information given to you following your procedure. If we do not reach you, we will leave a message.     If any biopsies were taken you will be contacted by phone or by letter within the next 1-3 weeks.  Please call us  at [phone] if you have not heard about  the biopsies in 3 weeks.    SIGNATURES/CONFIDENTIALITY: You and/or your care partner have signed paperwork which will be entered into your electronic medical record.  These signatures attest to the fact that that the information above on your After Visit Summary has been reviewed and is understood.  Full responsibility of the confidentiality of this discharge information lies with you and/or your care-partner.

## 2024-01-04 NOTE — Progress Notes (Signed)
 Pt's states no medical or surgical changes since previsit or office visit.

## 2024-01-05 ENCOUNTER — Telehealth: Payer: Self-pay

## 2024-01-05 NOTE — Telephone Encounter (Signed)
  Follow up Call-     01/04/2024    8:31 AM  Call back number  Post procedure Call Back phone  # [phone]  Permission to leave phone message Yes     Patient questions:  Do you have a fever, pain , or abdominal swelling? No. Pain Score  0 *  Have you tolerated food without any problems? Yes.    Have you been able to return to your normal activities? Yes.    Do you have any questions about your discharge instructions: Diet   No. Medications  No. Follow up visit  No.  Do you have questions or concerns about your Care? No.  Actions: * If pain score is 4 or above: No action needed, pain <4.

## 2024-01-08 LAB — SURGICAL PATHOLOGY

## 2024-01-15 ENCOUNTER — Ambulatory Visit (HOSPITAL_COMMUNITY): Attending: Nurse Practitioner

## 2024-01-15 ENCOUNTER — Encounter (HOSPITAL_COMMUNITY): Payer: Self-pay

## 2024-01-15 ENCOUNTER — Ambulatory Visit: Payer: Self-pay | Admitting: Gastroenterology

## 2024-01-15 DIAGNOSIS — M5416 Radiculopathy, lumbar region: Secondary | ICD-10-CM | POA: Diagnosis present

## 2024-01-15 DIAGNOSIS — M6281 Muscle weakness (generalized): Secondary | ICD-10-CM | POA: Diagnosis present

## 2024-01-15 NOTE — Therapy (Signed)
 OUTPATIENT PHYSICAL THERAPY THORACOLUMBAR TREATMENT   Patient Name: Leonard Mercer MRN: [id number] DOB:1955/05/15, 68 y.o., male Today's Date: 01/15/2024  END OF SESSION:  PT End of Session - 01/15/24 1246     Visit Number 5    Number of Visits 12    Date for Recertification  02/09/24    Authorization Type [employer]    Authorization Time Period --    Authorization - Visit Number 5    Authorization - Number of Visits 12    Progress Note Due on Visit 10    PT Start Time 1245    PT Stop Time 1326    PT Time Calculation (min) 41 min    Activity Tolerance Patient tolerated treatment well    Behavior During Therapy WFL for tasks assessed/performed              Past Medical History:  Diagnosis Date   Chest pain    Complication of anesthesia    Prolonged sedation HARD TO WAKE UP   DM TYPE 2    History of COVID-19    Hypercholesteremia    Hypertension    Kidney stone    Memory disorder 11/07/2018   Parkinson's disease (HCC) 09/12/2016   Scoliosis    Severe sepsis (HCC) DUE TO UTI    IN [facility] 08-01-2022 TO 08-06-2022   Past Surgical History:  Procedure Laterality Date   COLONOSCOPY     CYSTOSCOPY/URETEROSCOPY/HOLMIUM LASER/STENT PLACEMENT Right 07/15/2022   Procedure: CYSTOSCOPY RIGHT URETEROSCOPY/HOLMIUM LASER/STENT PLACEMENT;  Surgeon: Carolee Sherwood JONETTA DOUGLAS, MD;  Location: [facility];  Service: Urology;  Laterality: Right;  1 HR  FOR CASE   CYSTOSCOPY/URETEROSCOPY/HOLMIUM LASER/STENT PLACEMENT Right 08/22/2022   Procedure: CYSTOSCOPY RIGHT URETEROSCOPY/STENT EXCHANGE;  Surgeon: Carolee Sherwood JONETTA DOUGLAS, MD;  Location: [facility];  Service: Urology;  Laterality: Right;  60 MINS FOR CASE   HERNIA REPAIR     YRS AGO   Patient Active Problem List   Diagnosis Date Noted   Sepsis (HCC) 08/02/2022   Lactic acidosis 08/02/2022   Nausea & vomiting 08/02/2022   Abdominal pain 08/02/2022   Dehydration 08/02/2022   Type 2 diabetes mellitus with hyperglycemia (HCC)  08/02/2022   Hypomagnesemia 08/02/2022   BPH (benign prostatic hyperplasia) 08/02/2022   Memory disorder 11/07/2018   Fatigue 11/26/2017   Parkinson disease (HCC) 09/12/2016   Chronic right shoulder pain 05/26/2015   Tremor 05/26/2015   ED (erectile dysfunction) 05/20/2015   Mixed hyperlipidemia 05/20/2015   Diabetes (HCC) 05/02/2015   Essential hypertension 05/02/2015   Constipation 07/02/2012   Post-operative state 05/29/2012    PCP: Debrah Josette ORN., PA-C  REFERRING PROVIDER: Johnson Reusing, PA-C   REFERRING DIAG: Low back pain, unspecified   Rationale for Evaluation and Treatment: Rehabilitation  THERAPY DIAG:  Radiculopathy, lumbar region  Muscle weakness (generalized)  ONSET DATE: chronic  SUBJECTIVE:  SUBJECTIVE STATEMENT: Patient reports that his back is stiff and twingy.   Eval: Patient reports that he has been having back pain for a while now. He thought it was kidney stones initially, but that was ruled out. He notes that his scoliosis is getting worse. He notes that rolling over is really hard and he can't bend over to pick up anything. He feels that it has slowly getting worse. He also has Parkinson's Disease. He has extreme pain with left heel strike while walking.   PERTINENT HISTORY:  HTN, diabetes, and Parkinson's Disease  PAIN:  Are you having pain? Yes: NPRS scale: Current: 3/10 Best: 0/10 Worst: 8/10 Pain location: left low back Pain description: hurts, sharp, and radiates down left leg Aggravating factors: rolling, physical activity, twisting or turning to the left Relieving factors: sitting in chair  PRECAUTIONS: None  RED FLAGS: None   WEIGHT BEARING RESTRICTIONS: No  FALLS:  Has patient fallen in last 6 months? No  LIVING ENVIRONMENT: Lives  with: lives with their family Lives in: House/apartment Stairs: Yes: Internal: 15 steps; on right going up and External: 5 steps; can reach both; step to pattern Has following equipment at home: Grab bars  OCCUPATION: retired  PLOF: Independent  PATIENT GOALS: reduced pain   NEXT MD VISIT: none scheduled  OBJECTIVE:  Note: Objective measures were completed at Evaluation unless otherwise noted.  DIAGNOSTIC FINDINGS: 07/28/23 lumbar CT scan IMPRESSION: 1. No CT evidence for acute osseous abnormality. 2. Moderate to severe levoscoliosis. Multilevel degenerative changes. 3. Aortic atherosclerosis.  PATIENT SURVEYS:  Oswestry Low Back Pain Disability Questionnaire: 19/50 or 38% disability  COGNITION: Overall cognitive status: Within functional limits for tasks assessed     SENSATION: Patient reports no numbness or tingling  POSTURE: forward head and decreased lumbar lordosis  PALPATION: TTP w/ familiar pain: right lumbar paraspinals and QL  Increased tone noted along bilateral lumbar musculature  LUMBAR JOINT MOBILITY:  Hypomobile throughout with familiar pain at L4-5  LUMBAR ROM:   AROM eval  Flexion 75% limited   Extension 75% limited; familiar low back pain  Right lateral flexion 75%; familiar pain when returning to neutral  Left lateral flexion 75% limited  Right rotation 75% limited  Left rotation 90% limited   (Blank rows = not tested)  LOWER EXTREMITY ROM:  WFL for activities assessed  LOWER EXTREMITY MMT:    MMT Right eval Left eval  Hip flexion 4/5 3/5; painful   Hip extension    Hip abduction    Hip adduction    Hip internal rotation    Hip external rotation    Knee flexion 4-/5 4-/5  Knee extension 4/5 3/5  Ankle dorsiflexion 4/5 4/5  Ankle plantarflexion    Ankle inversion    Ankle eversion     (Blank rows = not tested)  FUNCTIONAL TESTS:  5 times sit to stand: 28.72 seconds without UE support  Timed up and go (TUG): 16.47  seconds  GAIT: Assistive device utilized: None Level of assistance: Complete Independence Comments: shuffling gait pattern  TREATMENT DATE:                                      01/15/24 EXERCISE LOG  Exercise Repetitions and Resistance Comments  Nustep  L4 x 6 minutes  BUE and BLE   Standing heel raise  20 reps  BUE support from parallel bars   Standing  HS curl   2 minutes  Alternating LE; BUE support from parallel bars   Static stance on foam   2.5 minutes  With dual tasking   Resisted row   GTB x 2 minutes    Sit to stand  10 reps  Without armrests   Seated hip ADD isometric  2 minutes w/ 5 second hold    Gait training  1 lap around clinic  With cueing for improved foot clearance   Standing gastroc stretch  2 minutes    Blank cell = exercise not performed today                                    01/02/24 EXERCISE LOG  Exercise Repetitions and Resistance Comments  Nustep  L3 x 6 minutes  BUE and BLE   Standing hip extension   2 minutes  Alternating LE; BUE support from parallel bars   Standing march  2 minutes  BUE support from parallel bars  Standing hip ABD  2 minutes  Alternating LE; BUE support from parallel bars   Mini squat  15 reps  BUE support from parallel bars   Rocker board  2 minutes  BUE support on parallel bars   Resisted row   RTB x 2 minutes    Resisted pull down  RTB x 2 minutes    Pallof press  RTB x 2 minutes each    Standing gastroc stretch  3 x 30 seconds    Blank cell = exercise not performed today                                    12/29/23 EXERCISE LOG  Exercise Repetitions and Resistance Comments  Nustep  L3 x 6 minutes  BUE and BLE   Rocker board  2 minutes  BUE support from parallel bars  Standing HS curl  2 minutes  Alternating LE; BUE support from parallel bars   Seated hip ADD isometric  2 minutes w/ 5 second hold    Standing hip ABD  20 reps each  BUE support from parallel bars   Static stance on foam  2 minutes  Increased sway; SBA   Resisted pull down  RTB x 2 minutes    LAQ 3# x 10 reps each     Blank cell = exercise not performed today   PATIENT EDUCATION:  Education details: HEP and expectations for soreness  Person educated: Patient Education method: Explanation, Demonstration, and Handouts Education comprehension: verbalized understanding and returned demonstration  HOME EXERCISE PROGRAM: Access Code: [email or web address] URL: [email or web address] Date: 12/26/2023 Prepared by: Lacinda Fass  Exercises - Supine Lower Trunk Rotation  - 1 x daily - 7 x weekly - 3 sets - 10 reps - Seated Long Arc Quad  - 1 x daily - 7 x weekly - 3 sets - 10 reps - Seated March  - 1 x daily - 7 x weekly - 3 sets - 10 reps - Slouch Overcorrect on Swiss Ball  - 1 x daily - 7 x weekly - 3 sets - 10 reps  Access Code: [email or web address] URL: [email or web address] Date: 12/05/2023 Prepared by: Lacinda Fass  Exercises - Seated Long Arc Quad  - 1 x daily - 7 x weekly - 2 sets - 10 reps - Seated March  - 1 x daily -  7 x weekly - 2 sets - 10 reps - Slouch Overcorrect on Swiss Ball  - 1 x daily - 7 x weekly - 2 sets - 10 reps  ASSESSMENT:  CLINICAL IMPRESSION: Patient was progressed with familiar interventions for improved lower extremity strength and mobility. He required minimal cueing with ambulation for improved foot clearance to reduce his risk of falling. Fatigue was his primary limitation with today's interventions as he required brief rest breaks throughout today's treatment. He reported feeling tired upon the conclusion of treatment. Patient continues to require skilled physical therapy to address her remaining impairments to maximize his functional mobility.  Eval: Patient is a 68 y.o. male who was seen today for physical therapy evaluation and treatment for left lumbar radiculopathy. He presented with moderate pain severity and irritability with lumbar extension and palpation to L4-5 and the surrounding  musculature reproducing his familiar symptoms. He also exhibited gait deviations and decreased lower extremity strength. Recommend that he continue with skilled physical therapy to address his impairments to maximize his functional mobility.   OBJECTIVE IMPAIRMENTS: Abnormal gait, decreased activity tolerance, decreased mobility, difficulty walking, decreased ROM, decreased strength, hypomobility, impaired tone, postural dysfunction, and pain.   ACTIVITY LIMITATIONS: carrying, lifting, bending, sitting, standing, sleeping, stairs, transfers, bed mobility, and locomotion level  PARTICIPATION LIMITATIONS: cleaning, shopping, and community activity  PERSONAL FACTORS: Past/current experiences, Time since onset of injury/illness/exacerbation, and 3+ comorbidities: HTN, diabetes, and Parkinson's Disease are also affecting patient's functional outcome.   REHAB POTENTIAL: Good  CLINICAL DECISION MAKING: Evolving/moderate complexity  EVALUATION COMPLEXITY: Moderate   GOALS: Goals reviewed with patient? Yes  SHORT TERM GOALS: Target date: 12/26/23  Patient will be independent with his initial HEP.  Baseline: Goal status: INITIAL  2.  Patient will be able to complete his daily activities without his familiar symptoms exceeding 6/10.  Baseline:  Goal status: INITIAL  3.  Patient will improve his ODI score to 28% disability or less for improved perceived function with his daily activities.  Baseline:  Goal status: INITIAL  LONG TERM GOALS: Target date: 01/16/24  Patient will be independent with his advanced HEP.  Baseline:  Goal status: INITIAL  2.  Patient will be able to complete his daily activities without his familiar symptoms exceeding 4/10.  Baseline:  Goal status: INITIAL  3.  Patient will be able to demonstrate proper lifting mechanics for improved function lifting objects from the floor.  Baseline:  Goal status: INITIAL  4.  Patient will improve his ODI score to 18%  disability or less for improved perceived function with his daily activities.  Baseline:  Goal status: INITIAL  5.  Patient will report minimal to no difficulty with bed mobility for improved household independence.  Baseline:  Goal status: INITIAL  PLAN:  PT FREQUENCY: 2x/week  PT DURATION: 6 weeks  PLANNED INTERVENTIONS: 97164- PT Re-evaluation, 97750- Physical Performance Testing, 97110-Therapeutic exercises, 97530- Therapeutic activity, [id number]- Neuromuscular re-education, 97535- Self Care, [id number]- Manual therapy, [phone]- Gait training, [phone]- Electrical stimulation (unattended), [phone]- Traction (mechanical), [phone] (1-2 muscles), [id number] (3+ muscles)- Dry Needling, Patient/Family education, Balance training, Stair training, Joint mobilization, Spinal mobilization, Cryotherapy, and Moist heat.  PLAN FOR NEXT SESSION: manual therapy, isometrics, log rolling, and review and update HEP, as needed   Lacinda JAYSON Fass, PT 01/15/2024, 5:53 PM

## 2024-01-18 ENCOUNTER — Ambulatory Visit (HOSPITAL_COMMUNITY)

## 2024-01-18 ENCOUNTER — Encounter (HOSPITAL_COMMUNITY): Payer: Self-pay

## 2024-01-18 DIAGNOSIS — M6281 Muscle weakness (generalized): Secondary | ICD-10-CM

## 2024-01-18 DIAGNOSIS — M5416 Radiculopathy, lumbar region: Secondary | ICD-10-CM

## 2024-01-18 NOTE — Therapy (Signed)
 OUTPATIENT PHYSICAL THERAPY THORACOLUMBAR TREATMENT   Patient Name: Leonard Mercer MRN: [id number] DOB:May 08, 1955, 68 y.o., male Today's Date: 01/18/2024  END OF SESSION:  PT End of Session - 01/18/24 1245     Visit Number 6    Number of Visits 12    Date for Recertification  02/09/24    Authorization Type [employer]    Authorization Time Period 12/05/23-03/04/24    Authorization - Visit Number 6    Authorization - Number of Visits 12    Progress Note Due on Visit 10    PT Start Time 1245    PT Stop Time 1328    PT Time Calculation (min) 43 min    Activity Tolerance Patient tolerated treatment well    Behavior During Therapy WFL for tasks assessed/performed               Past Medical History:  Diagnosis Date   Chest pain    Complication of anesthesia    Prolonged sedation HARD TO WAKE UP   DM TYPE 2    History of COVID-19    Hypercholesteremia    Hypertension    Kidney stone    Memory disorder 11/07/2018   Parkinson's disease (HCC) 09/12/2016   Scoliosis    Severe sepsis (HCC) DUE TO UTI    IN [facility] 08-01-2022 TO 08-06-2022   Past Surgical History:  Procedure Laterality Date   COLONOSCOPY     CYSTOSCOPY/URETEROSCOPY/HOLMIUM LASER/STENT PLACEMENT Right 07/15/2022   Procedure: CYSTOSCOPY RIGHT URETEROSCOPY/HOLMIUM LASER/STENT PLACEMENT;  Surgeon: Carolee Sherwood JONETTA DOUGLAS, MD;  Location: [facility];  Service: Urology;  Laterality: Right;  1 HR  FOR CASE   CYSTOSCOPY/URETEROSCOPY/HOLMIUM LASER/STENT PLACEMENT Right 08/22/2022   Procedure: CYSTOSCOPY RIGHT URETEROSCOPY/STENT EXCHANGE;  Surgeon: Carolee Sherwood JONETTA DOUGLAS, MD;  Location: [facility];  Service: Urology;  Laterality: Right;  60 MINS FOR CASE   HERNIA REPAIR     YRS AGO   Patient Active Problem List   Diagnosis Date Noted   Sepsis (HCC) 08/02/2022   Lactic acidosis 08/02/2022   Nausea & vomiting 08/02/2022   Abdominal pain 08/02/2022   Dehydration 08/02/2022   Type 2 diabetes mellitus with  hyperglycemia (HCC) 08/02/2022   Hypomagnesemia 08/02/2022   BPH (benign prostatic hyperplasia) 08/02/2022   Memory disorder 11/07/2018   Fatigue 11/26/2017   Parkinson disease (HCC) 09/12/2016   Chronic right shoulder pain 05/26/2015   Tremor 05/26/2015   ED (erectile dysfunction) 05/20/2015   Mixed hyperlipidemia 05/20/2015   Diabetes (HCC) 05/02/2015   Essential hypertension 05/02/2015   Constipation 07/02/2012   Post-operative state 05/29/2012    PCP: Debrah Josette ORN., PA-C  REFERRING PROVIDER: Johnson Reusing, PA-C   REFERRING DIAG: Low back pain, unspecified   Rationale for Evaluation and Treatment: Rehabilitation  THERAPY DIAG:  Radiculopathy, lumbar region  Muscle weakness (generalized)  ONSET DATE: chronic  SUBJECTIVE:  SUBJECTIVE STATEMENT: Patient reports that today is not a good day. He has been loading pottery in his truck this morning and it is causing his back to hurt more. He still has 1 more truck load to pack up once he gets home.   Eval: Patient reports that he has been having back pain for a while now. He thought it was kidney stones initially, but that was ruled out. He notes that his scoliosis is getting worse. He notes that rolling over is really hard and he can't bend over to pick up anything. He feels that it has slowly getting worse. He also has Parkinson's Disease. He has extreme pain with left heel strike while walking.   PERTINENT HISTORY:  HTN, diabetes, and Parkinson's Disease  PAIN:  Are you having pain? Yes: NPRS scale: Current: 6/10 Best: 0/10 Worst: 8/10 Pain location: left low back Pain description: hurts, sharp, and radiates down left leg Aggravating factors: rolling, physical activity, twisting or turning to the left Relieving factors: sitting  in chair  PRECAUTIONS: None  RED FLAGS: None   WEIGHT BEARING RESTRICTIONS: No  FALLS:  Has patient fallen in last 6 months? No  LIVING ENVIRONMENT: Lives with: lives with their family Lives in: House/apartment Stairs: Yes: Internal: 15 steps; on right going up and External: 5 steps; can reach both; step to pattern Has following equipment at home: Grab bars  OCCUPATION: retired  PLOF: Independent  PATIENT GOALS: reduced pain   NEXT MD VISIT: none scheduled  OBJECTIVE:  Note: Objective measures were completed at Evaluation unless otherwise noted.  DIAGNOSTIC FINDINGS: 07/28/23 lumbar CT scan IMPRESSION: 1. No CT evidence for acute osseous abnormality. 2. Moderate to severe levoscoliosis. Multilevel degenerative changes. 3. Aortic atherosclerosis.  PATIENT SURVEYS:  Oswestry Low Back Pain Disability Questionnaire: 19/50 or 38% disability  COGNITION: Overall cognitive status: Within functional limits for tasks assessed     SENSATION: Patient reports no numbness or tingling  POSTURE: forward head and decreased lumbar lordosis  PALPATION: TTP w/ familiar pain: right lumbar paraspinals and QL  Increased tone noted along bilateral lumbar musculature  LUMBAR JOINT MOBILITY:  Hypomobile throughout with familiar pain at L4-5  LUMBAR ROM:   AROM eval  Flexion 75% limited   Extension 75% limited; familiar low back pain  Right lateral flexion 75%; familiar pain when returning to neutral  Left lateral flexion 75% limited  Right rotation 75% limited  Left rotation 90% limited   (Blank rows = not tested)  LOWER EXTREMITY ROM:  WFL for activities assessed  LOWER EXTREMITY MMT:    MMT Right eval Left eval  Hip flexion 4/5 3/5; painful   Hip extension    Hip abduction    Hip adduction    Hip internal rotation    Hip external rotation    Knee flexion 4-/5 4-/5  Knee extension 4/5 3/5  Ankle dorsiflexion 4/5 4/5  Ankle plantarflexion    Ankle inversion     Ankle eversion     (Blank rows = not tested)  FUNCTIONAL TESTS:  5 times sit to stand: 28.72 seconds without UE support  Timed up and go (TUG): 16.47 seconds  GAIT: Assistive device utilized: None Level of assistance: Complete Independence Comments: shuffling gait pattern  TREATMENT DATE:                                      01/18/24 EXERCISE LOG  Exercise Repetitions and Resistance Comments  Manual therapy  STM to bilateral lumbar paraspinals, left QL, and latissimus dorsi Lumbar CPA's grade I-III to L1-4    Lumbar roll out  2.5 minutes  Seated; for lumbar flexion   Patient education   See below             Blank cell = exercise not performed today                                    01/15/24 EXERCISE LOG  Exercise Repetitions and Resistance Comments  Nustep  L4 x 6 minutes  BUE and BLE   Standing heel raise  20 reps  BUE support from parallel bars   Standing HS curl   2 minutes  Alternating LE; BUE support from parallel bars   Static stance on foam   2.5 minutes  With dual tasking   Resisted row   GTB x 2 minutes    Sit to stand  10 reps  Without armrests   Seated hip ADD isometric  2 minutes w/ 5 second hold    Gait training  1 lap around clinic  With cueing for improved foot clearance   Standing gastroc stretch  2 minutes    Blank cell = exercise not performed today                                    01/02/24 EXERCISE LOG  Exercise Repetitions and Resistance Comments  Nustep  L3 x 6 minutes  BUE and BLE   Standing hip extension   2 minutes  Alternating LE; BUE support from parallel bars   Standing march  2 minutes  BUE support from parallel bars  Standing hip ABD  2 minutes  Alternating LE; BUE support from parallel bars   Mini squat  15 reps  BUE support from parallel bars   Rocker board  2 minutes  BUE support on parallel bars   Resisted row   RTB x 2 minutes    Resisted pull down  RTB x 2 minutes    Pallof press  RTB x 2 minutes each    Standing gastroc  stretch  3 x 30 seconds    Blank cell = exercise not performed today   PATIENT EDUCATION:  Education details: activity modification, lifting mechanics, and expectations for soreness Person educated: Patient Education method: Explanation Education comprehension: verbalized understanding  HOME EXERCISE PROGRAM: Access Code: [email or web address] URL: [email or web address] Date: 12/26/2023 Prepared by: Lacinda Fass  Exercises - Supine Lower Trunk Rotation  - 1 x daily - 7 x weekly - 3 sets - 10 reps - Seated Long Arc Quad  - 1 x daily - 7 x weekly - 3 sets - 10 reps - Seated March  - 1 x daily - 7 x weekly - 3 sets - 10 reps - Slouch Overcorrect on Swiss Ball  - 1 x daily - 7 x weekly - 3 sets - 10 reps  Access Code: [email or web address] URL: [email or web address] Date: 12/05/2023 Prepared by: Lacinda Fass  Exercises - Seated Long Arc Quad  - 1 x daily - 7 x weekly - 2 sets - 10 reps - Seated March  - 1 x daily - 7 x weekly - 2 sets - 10 reps - Slouch Overcorrect on Swiss Ball  - 1 x daily -  7 x weekly - 2 sets - 10 reps  ASSESSMENT:  CLINICAL IMPRESSION: Today's treatment focused on manual therapy due to increased pain secondary to lifting pottery. Soft tissue mobilization to his left lumbar paraspinals and lumbar CPA's were the most effective at reducing his familiar symptoms. He was educated on activity modifications to avoid aggravating his familiar symptoms. He reported understanding. He reported feeling a little better upon the conclusion of treatment. Patient continues to require skilled physical therapy to address her remaining impairments to return to her prior level of function.    Eval: Patient is a 68 y.o. male who was seen today for physical therapy evaluation and treatment for left lumbar radiculopathy. He presented with moderate pain severity and irritability with lumbar extension and palpation to L4-5 and the surrounding musculature reproducing his familiar  symptoms. He also exhibited gait deviations and decreased lower extremity strength. Recommend that he continue with skilled physical therapy to address his impairments to maximize his functional mobility.   OBJECTIVE IMPAIRMENTS: Abnormal gait, decreased activity tolerance, decreased mobility, difficulty walking, decreased ROM, decreased strength, hypomobility, impaired tone, postural dysfunction, and pain.   ACTIVITY LIMITATIONS: carrying, lifting, bending, sitting, standing, sleeping, stairs, transfers, bed mobility, and locomotion level  PARTICIPATION LIMITATIONS: cleaning, shopping, and community activity  PERSONAL FACTORS: Past/current experiences, Time since onset of injury/illness/exacerbation, and 3+ comorbidities: HTN, diabetes, and Parkinson's Disease are also affecting patient's functional outcome.   REHAB POTENTIAL: Good  CLINICAL DECISION MAKING: Evolving/moderate complexity  EVALUATION COMPLEXITY: Moderate   GOALS: Goals reviewed with patient? Yes  SHORT TERM GOALS: Target date: 12/26/23  Patient will be independent with his initial HEP.  Baseline: Goal status: INITIAL  2.  Patient will be able to complete his daily activities without his familiar symptoms exceeding 6/10.  Baseline:  Goal status: INITIAL  3.  Patient will improve his ODI score to 28% disability or less for improved perceived function with his daily activities.  Baseline:  Goal status: INITIAL  LONG TERM GOALS: Target date: 01/16/24  Patient will be independent with his advanced HEP.  Baseline:  Goal status: INITIAL  2.  Patient will be able to complete his daily activities without his familiar symptoms exceeding 4/10.  Baseline:  Goal status: INITIAL  3.  Patient will be able to demonstrate proper lifting mechanics for improved function lifting objects from the floor.  Baseline:  Goal status: INITIAL  4.  Patient will improve his ODI score to 18% disability or less for improved perceived  function with his daily activities.  Baseline:  Goal status: INITIAL  5.  Patient will report minimal to no difficulty with bed mobility for improved household independence.  Baseline:  Goal status: INITIAL  PLAN:  PT FREQUENCY: 2x/week  PT DURATION: 6 weeks  PLANNED INTERVENTIONS: 97164- PT Re-evaluation, 97750- Physical Performance Testing, 97110-Therapeutic exercises, 97530- Therapeutic activity, [id number]- Neuromuscular re-education, 97535- Self Care, [id number]- Manual therapy, [phone]- Gait training, [phone]- Electrical stimulation (unattended), [phone]- Traction (mechanical), [phone] (1-2 muscles), [phone] (3+ muscles)- Dry Needling, Patient/Family education, Balance training, Stair training, Joint mobilization, Spinal mobilization, Cryotherapy, and Moist heat.  PLAN FOR NEXT SESSION: manual therapy, isometrics, log rolling, and review and update HEP, as needed   Lacinda JAYSON Fass, PT 01/18/2024, 3:21 PM

## 2024-06-20 ENCOUNTER — Ambulatory Visit: Admitting: Adult Health
